# Patient Record
Sex: Female | Born: 1965
Health system: Southern US, Community
[De-identification: ages and names within clinical notes are randomized; demographics above are authoritative.]

## PROBLEM LIST (undated history)

## (undated) DIAGNOSIS — R945 Abnormal results of liver function studies: Secondary | ICD-10-CM

## (undated) DIAGNOSIS — K760 Fatty (change of) liver, not elsewhere classified: Secondary | ICD-10-CM

## (undated) DIAGNOSIS — E785 Hyperlipidemia, unspecified: Secondary | ICD-10-CM

## (undated) DIAGNOSIS — R7611 Nonspecific reaction to tuberculin skin test without active tuberculosis: Secondary | ICD-10-CM

## (undated) DIAGNOSIS — C801 Malignant (primary) neoplasm, unspecified: Secondary | ICD-10-CM

## (undated) DIAGNOSIS — C189 Malignant neoplasm of colon, unspecified: Secondary | ICD-10-CM

## (undated) DIAGNOSIS — E669 Obesity, unspecified: Secondary | ICD-10-CM

## (undated) DIAGNOSIS — K219 Gastro-esophageal reflux disease without esophagitis: Secondary | ICD-10-CM

## (undated) DIAGNOSIS — B019 Varicella without complication: Secondary | ICD-10-CM

## (undated) DIAGNOSIS — N951 Menopausal and female climacteric states: Secondary | ICD-10-CM

## (undated) DIAGNOSIS — J302 Other seasonal allergic rhinitis: Secondary | ICD-10-CM

## (undated) DIAGNOSIS — E8881 Metabolic syndrome: Secondary | ICD-10-CM

## (undated) HISTORY — DX: Hyperlipidemia, unspecified: E78.5

## (undated) HISTORY — DX: Nonspecific reaction to tuberculin skin test without active tuberculosis: R76.11

## (undated) HISTORY — PX: SPINE SURGERY: SHX786

## (undated) HISTORY — DX: Obesity, unspecified: E66.9

## (undated) HISTORY — DX: Malignant (primary) neoplasm, unspecified: C80.1

## (undated) HISTORY — DX: Menopausal and female climacteric states: N95.1

## (undated) HISTORY — DX: Fatty (change of) liver, not elsewhere classified: K76.0

## (undated) HISTORY — PX: COLON SURGERY: SHX602

## (undated) HISTORY — DX: Other seasonal allergic rhinitis: J30.2

## (undated) HISTORY — DX: Gastro-esophageal reflux disease without esophagitis: K21.9

## (undated) HISTORY — PX: LUMBAR DISC SURGERY: SHX700

## (undated) HISTORY — DX: Abnormal results of liver function studies: R94.5

## (undated) HISTORY — DX: Metabolic syndrome: E88.81

## (undated) HISTORY — DX: Varicella without complication: B01.9

## (undated) HISTORY — PX: ABDOMINAL HYSTERECTOMY: SHX81

---

## 2011-04-10 HISTORY — PX: HEMICOLECTOMY: SHX854

## 2011-10-08 DIAGNOSIS — E785 Hyperlipidemia, unspecified: Secondary | ICD-10-CM

## 2011-10-08 DIAGNOSIS — J302 Other seasonal allergic rhinitis: Secondary | ICD-10-CM | POA: Insufficient documentation

## 2011-10-08 DIAGNOSIS — E782 Mixed hyperlipidemia: Secondary | ICD-10-CM | POA: Insufficient documentation

## 2011-10-08 HISTORY — DX: Other seasonal allergic rhinitis: J30.2

## 2011-10-08 HISTORY — DX: Hyperlipidemia, unspecified: E78.5

## 2012-04-09 DIAGNOSIS — C801 Malignant (primary) neoplasm, unspecified: Secondary | ICD-10-CM

## 2012-04-09 HISTORY — DX: Malignant (primary) neoplasm, unspecified: C80.1

## 2012-07-03 DIAGNOSIS — K219 Gastro-esophageal reflux disease without esophagitis: Secondary | ICD-10-CM | POA: Insufficient documentation

## 2014-09-05 ENCOUNTER — Ambulatory Visit (INDEPENDENT_AMBULATORY_CARE_PROVIDER_SITE_OTHER): Payer: 59 | Admitting: Family Medicine

## 2014-09-05 VITALS — BP 122/86 | HR 83 | Temp 98.8°F | Resp 14 | Ht 65.0 in | Wt 202.8 lb

## 2014-09-05 DIAGNOSIS — Z85038 Personal history of other malignant neoplasm of large intestine: Secondary | ICD-10-CM | POA: Diagnosis not present

## 2014-09-05 DIAGNOSIS — S29019A Strain of muscle and tendon of unspecified wall of thorax, initial encounter: Secondary | ICD-10-CM | POA: Diagnosis not present

## 2014-09-05 DIAGNOSIS — S2341XA Sprain of ribs, initial encounter: Secondary | ICD-10-CM

## 2014-09-05 MED ORDER — METHOCARBAMOL 500 MG PO TABS: 500.0000 mg | ORAL_TABLET | Freq: Four times a day (QID) | ORAL | Status: DC

## 2014-09-05 NOTE — Progress Notes (Signed)
Subjective:    Patient ID: Nicole Rosario, female    DOB: [birth year], 49 y.o.   MRN: [id number]  09/05/2014  Chest Pain and Back Pain   HPI This 49 y.o. female presents for evaluation of chest pain and rib pain.  No injury.  Onset three days ago.  Has progressively worsened this morning at 4:00am.  Located mid R back.  Movement makes worse.  Twisting makes worse.  Overhead or reaching with R hand makes worse.  No n/t/w.  Mild pain with deep breathing.  No SOB.  Severity 6/10.  Took 2 Aleve at 4:30am.  Some relief with Aleve.  No ice or heat.   Worked this week; yesterday mowed the grass; probably overdid it.  Lifted heavy bags of rocks; went swimming.  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  Works in two days.  History of colon cancer stage III; s/p surgical resection and chemotherapy.  Followed at [facility].  History of GERD and well controlled with Prilosec and Zantac.  PCP: Carlena Bjornstad.   Moved to [place] 1.5 years ago to work at [employer] in antenatal.   Review of Systems  Constitutional: Negative for fever, chills, diaphoresis and fatigue.  Respiratory: Negative for cough and shortness of breath.   Cardiovascular: Negative for chest pain, palpitations and leg swelling.  Gastrointestinal: Negative for nausea, vomiting, abdominal pain and diarrhea.  Musculoskeletal: Positive for myalgias and back pain. Negative for gait problem, neck pain and neck stiffness.  Skin: Negative for rash.  Neurological: Negative for weakness and numbness.    Past Medical History  Diagnosis Date  . GERD (gastroesophageal reflux disease)   . Cancer 04/09/2012    Colon cancer Stage III at splenic flexure; followed at [facility]; s/p surgery and chemo.   Past Surgical History  Procedure Laterality Date  . Colon surgery     Allergies  Allergen Reactions  . Erythromycin    Current Outpatient Prescriptions  Medication Sig Dispense Refill  . Cetirizine HCl (ZYRTEC PO) Take by mouth.    . Cholecalciferol (VITAMIN  D PO) Take by mouth.    . Cyanocobalamin (VITAMIN B-12 PO) Take by mouth.    . IRON PO Take by mouth.    Marland Kitchen omeprazole (PRILOSEC) 20 MG capsule Take 20 mg by mouth daily.    . ranitidine (ZANTAC) 150 MG tablet Take 150 mg by mouth 2 (two) times daily.    . methocarbamol (ROBAXIN) 500 MG tablet Take 1-2 tablets (500-1,000 mg total) by mouth 4 (four) times daily. 60 tablet 0   No current facility-administered medications for this visit.   History   Social History  . Marital Status: Single    Spouse Name: N/A  . Number of Children: N/A  . Years of Education: N/A   Occupational History  . RN     [employer] in antenatal and L&D.   Social History Main Topics  . Smoking status: Never Smoker   . Smokeless tobacco: Not on file  . Alcohol Use: Not on file  . Drug Use: Not on file  . Sexual Activity: Not on file   Other Topics Concern  . Not on file   Social History Narrative       Objective:    BP 122/86 mmHg  Pulse 83  Temp(Src) 98.8 F (37.1 C) (Oral)  Resp 14  Ht 5\' 5"  (1.651 m)  Wt 202 lb 12.8 oz (91.989 kg)  BMI 33.75 kg/m2  SpO2 98% Physical Exam  Constitutional:  She is oriented to person, place, and time. She appears well-developed and well-nourished. No distress.  HENT:  Head: Normocephalic and atraumatic.  Eyes: Conjunctivae are normal. Pupils are equal, round, and reactive to light.  Neck: Normal range of motion. Neck supple.  Cardiovascular: Normal rate, regular rhythm and normal heart sounds.  Exam reveals no gallop and no friction rub.   No murmur heard. Pulmonary/Chest: Effort normal and breath sounds normal. She has no wheezes. She has no rales.  Musculoskeletal:       Cervical back: She exhibits pain. She exhibits normal range of motion, no tenderness, no bony tenderness and no spasm.       Thoracic back: She exhibits decreased range of motion, tenderness, bony tenderness and pain. She exhibits no spasm.  Cervical spine: non-tender midline;  non-tender paraspinal regions B; full ROM cervical spine without limitation.  Motor 5/5 BUE.  Grip 5/5. Thoracic spine: No midline TTP; +TTP lateral R ribs.  Pain with extension, rotation side to side.    Neurological: She is alert and oriented to person, place, and time.  Skin: She is not diaphoretic.  Psychiatric: She has a normal mood and affect. Her behavior is normal.  Nursing note and vitals reviewed.  No results found for this or any previous visit.     Assessment & Plan:   1. Sprain and strain of ribs, initial encounter   2. H/O colon cancer, stage III    -New. -Pt declined xray at this time; if no improvement, RTC 1-2 weeks for xray considering history of Colon cancer. -Continue Aleve bid to tid scheduled. -Rx for Robaxin provided. -Recommend rest, stretching; avoid lifting more than ten pounds for the next two weeks. -Heat to area bid to tid for 15 minutes.   Meds ordered this encounter  Medications  . omeprazole (PRILOSEC) 20 MG capsule    Sig: Take 20 mg by mouth daily.  . Cetirizine HCl (ZYRTEC PO)    Sig: Take by mouth.  . ranitidine (ZANTAC) 150 MG tablet    Sig: Take 150 mg by mouth 2 (two) times daily.  . IRON PO    Sig: Take by mouth.  . Cholecalciferol (VITAMIN D PO)    Sig: Take by mouth.  . Cyanocobalamin (VITAMIN B-12 PO)    Sig: Take by mouth.  . methocarbamol (ROBAXIN) 500 MG tablet    Sig: Take 1-2 tablets (500-1,000 mg total) by mouth 4 (four) times daily.    Dispense:  60 tablet    Refill:  0    No Follow-up on file.     Romayne Ticas Elayne Guerin, M.D. [facility] [address] [place], [place]  [address] [phone] phone [phone] fax

## 2015-04-22 DIAGNOSIS — R918 Other nonspecific abnormal finding of lung field: Secondary | ICD-10-CM | POA: Diagnosis not present

## 2015-04-22 DIAGNOSIS — Z79899 Other long term (current) drug therapy: Secondary | ICD-10-CM | POA: Diagnosis not present

## 2015-04-22 DIAGNOSIS — C187 Malignant neoplasm of sigmoid colon: Secondary | ICD-10-CM | POA: Diagnosis not present

## 2015-04-22 DIAGNOSIS — Z9889 Other specified postprocedural states: Secondary | ICD-10-CM | POA: Diagnosis not present

## 2015-04-22 DIAGNOSIS — Z7982 Long term (current) use of aspirin: Secondary | ICD-10-CM | POA: Diagnosis not present

## 2015-04-22 DIAGNOSIS — C19 Malignant neoplasm of rectosigmoid junction: Secondary | ICD-10-CM | POA: Diagnosis not present

## 2015-04-22 DIAGNOSIS — R911 Solitary pulmonary nodule: Secondary | ICD-10-CM | POA: Diagnosis not present

## 2015-06-02 ENCOUNTER — Other Ambulatory Visit: Payer: Self-pay

## 2015-06-02 DIAGNOSIS — N926 Irregular menstruation, unspecified: Secondary | ICD-10-CM | POA: Diagnosis not present

## 2015-06-02 DIAGNOSIS — E559 Vitamin D deficiency, unspecified: Secondary | ICD-10-CM | POA: Diagnosis not present

## 2015-06-02 DIAGNOSIS — Z1231 Encounter for screening mammogram for malignant neoplasm of breast: Secondary | ICD-10-CM

## 2015-06-02 DIAGNOSIS — Z6834 Body mass index (BMI) 34.0-34.9, adult: Secondary | ICD-10-CM | POA: Diagnosis not present

## 2015-06-02 DIAGNOSIS — E782 Mixed hyperlipidemia: Secondary | ICD-10-CM | POA: Diagnosis not present

## 2015-06-13 ENCOUNTER — Ambulatory Visit
Admission: RE | Admit: 2015-06-13 | Discharge: 2015-06-13 | Disposition: A | Discharge status: Home / Self Care | Payer: 59 | Source: Ambulatory Visit

## 2015-06-13 DIAGNOSIS — Z1231 Encounter for screening mammogram for malignant neoplasm of breast: Secondary | ICD-10-CM | POA: Diagnosis not present

## 2015-06-21 DIAGNOSIS — M50322 Other cervical disc degeneration at C5-C6 level: Secondary | ICD-10-CM | POA: Diagnosis not present

## 2015-06-21 DIAGNOSIS — M5442 Lumbago with sciatica, left side: Secondary | ICD-10-CM | POA: Diagnosis not present

## 2015-06-21 DIAGNOSIS — M9901 Segmental and somatic dysfunction of cervical region: Secondary | ICD-10-CM | POA: Diagnosis not present

## 2015-06-21 DIAGNOSIS — M9902 Segmental and somatic dysfunction of thoracic region: Secondary | ICD-10-CM | POA: Diagnosis not present

## 2015-06-21 DIAGNOSIS — M9903 Segmental and somatic dysfunction of lumbar region: Secondary | ICD-10-CM | POA: Diagnosis not present

## 2015-06-21 DIAGNOSIS — Q72812 Congenital shortening of left lower limb: Secondary | ICD-10-CM | POA: Diagnosis not present

## 2015-06-21 DIAGNOSIS — M9904 Segmental and somatic dysfunction of sacral region: Secondary | ICD-10-CM | POA: Diagnosis not present

## 2015-06-21 DIAGNOSIS — M9905 Segmental and somatic dysfunction of pelvic region: Secondary | ICD-10-CM | POA: Diagnosis not present

## 2015-06-21 DIAGNOSIS — M5384 Other specified dorsopathies, thoracic region: Secondary | ICD-10-CM | POA: Diagnosis not present

## 2015-06-27 DIAGNOSIS — M9904 Segmental and somatic dysfunction of sacral region: Secondary | ICD-10-CM | POA: Diagnosis not present

## 2015-06-27 DIAGNOSIS — M9902 Segmental and somatic dysfunction of thoracic region: Secondary | ICD-10-CM | POA: Diagnosis not present

## 2015-06-27 DIAGNOSIS — M5384 Other specified dorsopathies, thoracic region: Secondary | ICD-10-CM | POA: Diagnosis not present

## 2015-06-27 DIAGNOSIS — M9903 Segmental and somatic dysfunction of lumbar region: Secondary | ICD-10-CM | POA: Diagnosis not present

## 2015-06-27 DIAGNOSIS — M9905 Segmental and somatic dysfunction of pelvic region: Secondary | ICD-10-CM | POA: Diagnosis not present

## 2015-06-27 DIAGNOSIS — M9901 Segmental and somatic dysfunction of cervical region: Secondary | ICD-10-CM | POA: Diagnosis not present

## 2015-06-27 DIAGNOSIS — M50322 Other cervical disc degeneration at C5-C6 level: Secondary | ICD-10-CM | POA: Diagnosis not present

## 2015-06-27 DIAGNOSIS — M5442 Lumbago with sciatica, left side: Secondary | ICD-10-CM | POA: Diagnosis not present

## 2015-06-27 DIAGNOSIS — Q72812 Congenital shortening of left lower limb: Secondary | ICD-10-CM | POA: Diagnosis not present

## 2015-06-29 DIAGNOSIS — N926 Irregular menstruation, unspecified: Secondary | ICD-10-CM | POA: Diagnosis not present

## 2015-06-29 DIAGNOSIS — Z124 Encounter for screening for malignant neoplasm of cervix: Secondary | ICD-10-CM | POA: Diagnosis not present

## 2015-06-29 DIAGNOSIS — Z01419 Encounter for gynecological examination (general) (routine) without abnormal findings: Secondary | ICD-10-CM | POA: Diagnosis not present

## 2015-06-29 LAB — HM PAP SMEAR: HM Pap smear: NEGATIVE

## 2015-07-06 DIAGNOSIS — M50322 Other cervical disc degeneration at C5-C6 level: Secondary | ICD-10-CM | POA: Diagnosis not present

## 2015-07-06 DIAGNOSIS — M9903 Segmental and somatic dysfunction of lumbar region: Secondary | ICD-10-CM | POA: Diagnosis not present

## 2015-07-06 DIAGNOSIS — M5442 Lumbago with sciatica, left side: Secondary | ICD-10-CM | POA: Diagnosis not present

## 2015-07-06 DIAGNOSIS — Q72812 Congenital shortening of left lower limb: Secondary | ICD-10-CM | POA: Diagnosis not present

## 2015-07-06 DIAGNOSIS — M9902 Segmental and somatic dysfunction of thoracic region: Secondary | ICD-10-CM | POA: Diagnosis not present

## 2015-07-06 DIAGNOSIS — M9901 Segmental and somatic dysfunction of cervical region: Secondary | ICD-10-CM | POA: Diagnosis not present

## 2015-07-06 DIAGNOSIS — M9904 Segmental and somatic dysfunction of sacral region: Secondary | ICD-10-CM | POA: Diagnosis not present

## 2015-07-06 DIAGNOSIS — M9905 Segmental and somatic dysfunction of pelvic region: Secondary | ICD-10-CM | POA: Diagnosis not present

## 2015-07-06 DIAGNOSIS — M5384 Other specified dorsopathies, thoracic region: Secondary | ICD-10-CM | POA: Diagnosis not present

## 2015-07-14 DIAGNOSIS — M5442 Lumbago with sciatica, left side: Secondary | ICD-10-CM | POA: Diagnosis not present

## 2015-07-14 DIAGNOSIS — M9905 Segmental and somatic dysfunction of pelvic region: Secondary | ICD-10-CM | POA: Diagnosis not present

## 2015-07-14 DIAGNOSIS — M50322 Other cervical disc degeneration at C5-C6 level: Secondary | ICD-10-CM | POA: Diagnosis not present

## 2015-07-14 DIAGNOSIS — M9901 Segmental and somatic dysfunction of cervical region: Secondary | ICD-10-CM | POA: Diagnosis not present

## 2015-07-14 DIAGNOSIS — M5384 Other specified dorsopathies, thoracic region: Secondary | ICD-10-CM | POA: Diagnosis not present

## 2015-07-14 DIAGNOSIS — M9902 Segmental and somatic dysfunction of thoracic region: Secondary | ICD-10-CM | POA: Diagnosis not present

## 2015-07-14 DIAGNOSIS — Q72812 Congenital shortening of left lower limb: Secondary | ICD-10-CM | POA: Diagnosis not present

## 2015-07-14 DIAGNOSIS — M9904 Segmental and somatic dysfunction of sacral region: Secondary | ICD-10-CM | POA: Diagnosis not present

## 2015-07-14 DIAGNOSIS — M9903 Segmental and somatic dysfunction of lumbar region: Secondary | ICD-10-CM | POA: Diagnosis not present

## 2015-07-20 DIAGNOSIS — H5213 Myopia, bilateral: Secondary | ICD-10-CM | POA: Diagnosis not present

## 2015-07-20 DIAGNOSIS — H524 Presbyopia: Secondary | ICD-10-CM | POA: Diagnosis not present

## 2015-07-20 DIAGNOSIS — H52223 Regular astigmatism, bilateral: Secondary | ICD-10-CM | POA: Diagnosis not present

## 2015-07-21 DIAGNOSIS — R945 Abnormal results of liver function studies: Secondary | ICD-10-CM | POA: Diagnosis not present

## 2015-08-04 DIAGNOSIS — Z980 Intestinal bypass and anastomosis status: Secondary | ICD-10-CM | POA: Diagnosis not present

## 2015-08-04 DIAGNOSIS — R1032 Left lower quadrant pain: Secondary | ICD-10-CM | POA: Diagnosis not present

## 2015-08-04 DIAGNOSIS — Z85038 Personal history of other malignant neoplasm of large intestine: Secondary | ICD-10-CM | POA: Diagnosis not present

## 2015-08-04 DIAGNOSIS — Z79899 Other long term (current) drug therapy: Secondary | ICD-10-CM | POA: Diagnosis not present

## 2015-08-04 DIAGNOSIS — Z791 Long term (current) use of non-steroidal anti-inflammatories (NSAID): Secondary | ICD-10-CM | POA: Diagnosis not present

## 2015-08-04 DIAGNOSIS — Z7982 Long term (current) use of aspirin: Secondary | ICD-10-CM | POA: Diagnosis not present

## 2015-08-11 DIAGNOSIS — N926 Irregular menstruation, unspecified: Secondary | ICD-10-CM | POA: Diagnosis not present

## 2015-10-28 DIAGNOSIS — Z9221 Personal history of antineoplastic chemotherapy: Secondary | ICD-10-CM | POA: Diagnosis not present

## 2015-10-28 DIAGNOSIS — Z9049 Acquired absence of other specified parts of digestive tract: Secondary | ICD-10-CM | POA: Diagnosis not present

## 2015-10-28 DIAGNOSIS — R918 Other nonspecific abnormal finding of lung field: Secondary | ICD-10-CM | POA: Diagnosis not present

## 2015-10-28 DIAGNOSIS — Z923 Personal history of irradiation: Secondary | ICD-10-CM | POA: Diagnosis not present

## 2015-10-28 DIAGNOSIS — C187 Malignant neoplasm of sigmoid colon: Secondary | ICD-10-CM | POA: Diagnosis not present

## 2015-10-28 DIAGNOSIS — Z85038 Personal history of other malignant neoplasm of large intestine: Secondary | ICD-10-CM | POA: Diagnosis not present

## 2015-10-28 DIAGNOSIS — Z08 Encounter for follow-up examination after completed treatment for malignant neoplasm: Secondary | ICD-10-CM | POA: Diagnosis not present

## 2015-10-28 DIAGNOSIS — Z7982 Long term (current) use of aspirin: Secondary | ICD-10-CM | POA: Diagnosis not present

## 2015-10-28 DIAGNOSIS — Z79899 Other long term (current) drug therapy: Secondary | ICD-10-CM | POA: Diagnosis not present

## 2015-11-29 DIAGNOSIS — Z0000 Encounter for general adult medical examination without abnormal findings: Secondary | ICD-10-CM | POA: Diagnosis not present

## 2016-01-10 MED FILL — VENTOLIN HFA 90 MCG INHALER: 108 (90 BAS | 17 days supply | Qty: 18 | Fill #0

## 2016-01-10 MED FILL — predniSONE 20 MG TABS: 20 | 5 days supply | Qty: 15 | Fill #0

## 2016-04-30 DIAGNOSIS — C189 Malignant neoplasm of colon, unspecified: Secondary | ICD-10-CM | POA: Diagnosis not present

## 2016-04-30 DIAGNOSIS — C187 Malignant neoplasm of sigmoid colon: Secondary | ICD-10-CM | POA: Diagnosis not present

## 2016-08-21 ENCOUNTER — Ambulatory Visit (INDEPENDENT_AMBULATORY_CARE_PROVIDER_SITE_OTHER): Payer: 59

## 2016-08-21 ENCOUNTER — Ambulatory Visit (HOSPITAL_COMMUNITY)
Admission: EM | Admit: 2016-08-21 | Discharge: 2016-08-21 | Disposition: A | Discharge status: Home / Self Care | Payer: 59 | Attending: Internal Medicine | Admitting: Internal Medicine

## 2016-08-21 ENCOUNTER — Encounter (HOSPITAL_COMMUNITY): Payer: Self-pay | Admitting: Family Medicine

## 2016-08-21 DIAGNOSIS — S60221A Contusion of right hand, initial encounter: Secondary | ICD-10-CM

## 2016-08-21 DIAGNOSIS — S6991XA Unspecified injury of right wrist, hand and finger(s), initial encounter: Secondary | ICD-10-CM | POA: Diagnosis not present

## 2016-08-21 NOTE — ED Triage Notes (Signed)
Pt her for right hand injury. sts yesterday some PVC pipe fell on her hand.

## 2016-08-21 NOTE — ED Provider Notes (Signed)
CSN: [id number]     Arrival date & time 08/21/16  1043 History   First MD Initiated Contact with Patient 08/21/16 1123     Chief Complaint  Patient presents with  . Hand Injury   (Consider location/radiation/quality/duration/timing/severity/associated sxs/prior Treatment) 51 year old female presents to clinic for evaluation of hand injury. She was at home yesterday, when a PVC pipe fell on her right hand. She is right handed, states she has 1 out of 10 pain dull, aching, nonradiating, she has no loss of function in the extremity, is able to form a full grip. Pain is intermittent, gradually improving, and helped with over-the-counter ibuprofen and Tylenol.   The history is provided by the patient.  Hand Injury    Past Medical History:  Diagnosis Date  . Cancer ([place]) 04/09/2012   Colon cancer Stage III at splenic flexure; followed at [facility]; s/p surgery and chemo.  Marland Kitchen GERD (gastroesophageal reflux disease)    Past Surgical History:  Procedure Laterality Date  . COLON SURGERY     Family History  Problem Relation Age of Onset  . Heart disease Mother   . Hypertension Father   . Cancer Maternal Grandmother   . Heart disease Maternal Grandfather   . Cancer Paternal Grandmother    Social History  Substance Use Topics  . Smoking status: Never Smoker  . Smokeless tobacco: Never Used  . Alcohol use Not on file   OB History    No data available     Review of Systems  Constitutional: Negative.   HENT: Negative.   Respiratory: Negative.   Cardiovascular: Negative.   Gastrointestinal: Negative.   Musculoskeletal: Positive for joint swelling.  Skin: Positive for color change.  Neurological: Negative.     Allergies  Erythromycin  Home Medications   Prior to Admission medications   Medication Sig Start Date End Date Taking? Authorizing Provider  Cetirizine HCl (ZYRTEC PO) Take by mouth.    [provider]  Cholecalciferol (VITAMIN D PO) Take by mouth.    [provider]  Cyanocobalamin (VITAMIN B-12 PO) Take by mouth.    [provider]  IRON PO Take by mouth.    [provider]  methocarbamol (ROBAXIN) 500 MG tablet Take 1-2 tablets (500-1,000 mg total) by mouth 4 (four) times daily. 09/05/14   Wardell Honour, MD  omeprazole (PRILOSEC) 20 MG capsule Take 20 mg by mouth daily.    [provider]  ranitidine (ZANTAC) 150 MG tablet Take 150 mg by mouth 2 (two) times daily.    [provider]   Meds Ordered and Administered this Visit  Medications - No data to display  BP (!) 142/92   Pulse 78   Resp 18   SpO2 98%  No data found.   Physical Exam  Constitutional: She is oriented to person, place, and time. She appears well-developed and well-nourished. No distress.  HENT:  Head: Normocephalic and atraumatic.  Right Ear: External ear normal.  Left Ear: External ear normal.  Eyes: Conjunctivae are normal.  Neck: Normal range of motion.  Musculoskeletal: She exhibits tenderness. She exhibits no deformity.  Area of tenderness, and the fourth distal metaphalangeal joint, capillary refill less than 2 seconds, sensory, motor function remain intact distally.  Neurological: She is alert and oriented to person, place, and time.  Skin: Skin is warm and dry. Capillary refill takes less than 2 seconds. No rash noted. She is not diaphoretic. No erythema.  Psychiatric: She has a normal mood and  affect. Her behavior is normal.  Nursing note and vitals reviewed.   Urgent Care Course     Procedures (including critical care time)  Labs Review Labs Reviewed - No data to display  Imaging Review Dg Hand Complete Right  Result Date: 08/21/2016 CLINICAL DATA:  Injury right hand when a height rolled off the roof yesterday and struck the hand. EXAM: RIGHT HAND - COMPLETE 3+ VIEW COMPARISON:  None in PACs FINDINGS: The bones are subjectively adequately mineralized. The joint spaces are well maintained. There is no  acute fracture or dislocation. The soft tissues of the hand exhibit no acute abnormalities. The visualized portions of the carpal bones and distal radius and ulna appear normal. IMPRESSION: There is no acute bony abnormality of the right hand. Electronically Signed   By: David  Martinique M.D.   On: 08/21/2016 11:55       MDM   1. Contusion of right hand, initial encounter    No fracture dislocation seen on x-ray. Recommended rest, ice, elevation of the affected hand, over-the-counter Tylenol, or ibuprofen as needed. Follow-up with primary care, return to clinic, or follow-up with hand specialist if pain persists    Barnet Glasgow, NP 08/21/16 1222

## 2016-08-21 NOTE — Discharge Instructions (Signed)
No fracture or dislocation on Xray, recommend rest, ice, elevation, OTC tylenol or ibuprofen. If pain persists or fails to resolve follow up with your pcp, return to clinic, or go to a hand specialists as needed.

## 2016-08-21 NOTE — ED Notes (Signed)
Bed: UC07 Expected date:  Expected time:  Means of arrival:  Comments: Hold for Marschke, A (hand injury)

## 2016-10-29 DIAGNOSIS — R911 Solitary pulmonary nodule: Secondary | ICD-10-CM | POA: Diagnosis not present

## 2016-10-29 DIAGNOSIS — C189 Malignant neoplasm of colon, unspecified: Secondary | ICD-10-CM | POA: Diagnosis not present

## 2016-10-29 DIAGNOSIS — C187 Malignant neoplasm of sigmoid colon: Secondary | ICD-10-CM | POA: Diagnosis not present

## 2017-02-23 DIAGNOSIS — H5213 Myopia, bilateral: Secondary | ICD-10-CM | POA: Diagnosis not present

## 2017-02-23 DIAGNOSIS — H524 Presbyopia: Secondary | ICD-10-CM | POA: Diagnosis not present

## 2017-02-23 DIAGNOSIS — H52223 Regular astigmatism, bilateral: Secondary | ICD-10-CM | POA: Diagnosis not present

## 2017-04-04 ENCOUNTER — Ambulatory Visit (HOSPITAL_COMMUNITY)
Admission: EM | Admit: 2017-04-04 | Discharge: 2017-04-04 | Disposition: A | Discharge status: Home / Self Care | Payer: 59 | Attending: Emergency Medicine | Admitting: Emergency Medicine

## 2017-04-04 ENCOUNTER — Telehealth: Payer: 59 | Admitting: Family

## 2017-04-04 ENCOUNTER — Other Ambulatory Visit: Payer: Self-pay

## 2017-04-04 ENCOUNTER — Encounter (HOSPITAL_COMMUNITY): Payer: Self-pay | Admitting: Emergency Medicine

## 2017-04-04 DIAGNOSIS — M5432 Sciatica, left side: Secondary | ICD-10-CM

## 2017-04-04 DIAGNOSIS — S39012A Strain of muscle, fascia and tendon of lower back, initial encounter: Secondary | ICD-10-CM

## 2017-04-04 MED ORDER — PREDNISONE 10 MG (21) PO TBPK: ORAL_TABLET | ORAL | 0 refills | Status: DC

## 2017-04-04 MED ORDER — TRAMADOL HCL 50 MG PO TABS: 50.0000 mg | ORAL_TABLET | Freq: Four times a day (QID) | ORAL | 0 refills | Status: DC | PRN

## 2017-04-04 MED FILL — traMADol HCL 50 MG TABS: 50 | 4 days supply | Qty: 15 | Fill #0

## 2017-04-04 MED FILL — predniSONE 10 MG TABS: 10 | 6 days supply | Qty: 21 | Fill #0

## 2017-04-04 NOTE — ED Triage Notes (Addendum)
Back pain 2 weeks Sunday.  No known injury.  Pain from buttocks down left leg.   Patient has a history of low back pain that she goes to chiropractor for periodically

## 2017-04-04 NOTE — Discharge Instructions (Signed)
Meds as directed, take prednisone with food.

## 2017-04-04 NOTE — Progress Notes (Signed)
Thank you for the details you included in the comment boxes. Those details are very helpful in determining the best course of treatment for you and help [place] to provide the best care. Given that you are already using the treatments we would give you here, without improvement, this needs to be evaluated face to face to rule out serious nerve impingement.  Based on what you shared with me it looks like you have a serious condition that should be evaluated in a face to face office visit.  NOTE: Even if you have entered your credit card information for this eVisit, you will not be charged.   If you are having a true medical emergency please call 911.  If you need an urgent face to face visit, [facility] has four urgent care centers for your convenience.  If you need care fast and have a high deductible or no insurance consider:   [email or web address]  [phone]  [address], [address] [place], [place] [address] 8 am to 8 pm Monday-Friday 10 am to 4 pm Saturday-Sunday   The following sites will take your  insurance:    . [facility] [place] a Provider at this Location  [address] [place], [place] [address] . 10 am to 8 pm Monday-Friday . 12 pm to 8 pm Saturday-Sunday   . [facility] [place] a Provider at this Location  [place], [place], [place] [address] . 8 am to 8 pm Monday-Friday . 9 am to 6 pm Saturday . 11 am to 6 pm Sunday   . [facility] at [place] Get Driving Directions  [address] [place],  [address] . 8 am to 8 pm Monday-Friday . 8 am to 4 pm Saturday-Sunday   Your e-visit answers were reviewed by a board certified advanced clinical practitioner to complete your personal care plan.  Thank you for using e-Visits.

## 2017-04-04 NOTE — ED Provider Notes (Signed)
[facility]    CSN: [id number] Arrival date & time: 04/04/17  1005     History   Chief Complaint Chief Complaint  Patient presents with  . Back Pain    HPI Nicole Rosario is a 51 y.o. female.   51 year old female complaining of pain to the left buttock radiating down to the left posterior leg for about 1-1/2 weeks. Describes it as often a sharp shooting type pain sometimes a dull nagging crescendo, decrescendo type pain. Denies any known injury. No fall, no blunt trauma, no straining or exercising or other activities. She woke up one morning with the pain. Is primarily starting in the left buttock and radiating down the posterior left thigh sometimes into the calf. It seems to be worse in the morning upon awakening. It is worse when sitting and performing certain movements. Seems to feel better when standing and ambulating.       Past Medical History:  Diagnosis Date  . Cancer ([place]) 04/09/2012   Colon cancer Stage III at splenic flexure; followed at [facility]; s/p surgery and chemo.  Marland Kitchen GERD (gastroesophageal reflux disease)     Patient Active Problem List   Diagnosis Date Noted  . H/O colon cancer, stage III 09/05/2014    Past Surgical History:  Procedure Laterality Date  . COLON SURGERY      OB History    No data available       Home Medications    Prior to Admission medications   Medication Sig Start Date End Date Taking? Authorizing Provider  methocarbamol (ROBAXIN) 500 MG tablet Take 1-2 tablets (500-1,000 mg total) by mouth 4 (four) times daily. 09/05/14  Yes Wardell Honour, MD  Cetirizine HCl (ZYRTEC PO) Take by mouth.    [provider]  Cholecalciferol (VITAMIN D PO) Take by mouth.    [provider]  Cyanocobalamin (VITAMIN B-12 PO) Take by mouth.    [provider]  IRON PO Take by mouth.    [provider]  omeprazole (PRILOSEC) 20 MG capsule Take 20 mg by mouth daily.    [provider]  predniSONE  (STERAPRED UNI-PAK 21 TAB) 10 MG (21) TBPK tablet Dispense one 6 day pack. Take as directed with food. 04/04/17   Janne Napoleon, NP  ranitidine (ZANTAC) 150 MG tablet Take 150 mg by mouth 2 (two) times daily.    [provider]  traMADol (ULTRAM) 50 MG tablet Take 1 tablet (50 mg total) by mouth every 6 (six) hours as needed. 04/04/17   Janne Napoleon, NP    Family History Family History  Problem Relation Age of Onset  . Heart disease Mother   . Hypertension Father   . Cancer Maternal Grandmother   . Heart disease Maternal Grandfather   . Cancer Paternal Grandmother     Social History Social History   Tobacco Use  . Smoking status: Never Smoker  . Smokeless tobacco: Never Used  Substance Use Topics  . Alcohol use: Not on file  . Drug use: Not on file     Allergies   Erythromycin   Review of Systems Review of Systems  Constitutional: Negative for activity change, chills and fever.  HENT: Negative.   Respiratory: Negative.   Cardiovascular: Negative.   Musculoskeletal:       As per HPI  Skin: Negative for color change, pallor and rash.  Neurological: Negative.      Physical Exam Triage Vital Signs ED Triage Vitals  Enc Vitals Group  BP 04/04/17 1033 (!) 155/97     Pulse Rate 04/04/17 1033 84     Resp 04/04/17 1033 18     Temp 04/04/17 1033 97.6 F (36.4 C)     Temp Source 04/04/17 1033 Oral     SpO2 04/04/17 1033 97 %     Weight --      Height --      Head Circumference --      Peak Flow --      Pain Score 04/04/17 1031 5     Pain Loc --      Pain Edu? --      Excl. in [place]? --    No data found.  Updated Vital Signs BP (!) 155/97 (BP Location: Left Arm)   Pulse 84   Temp 97.6 F (36.4 C) (Oral)   Resp 18   SpO2 97%   Visual Acuity Right Eye Distance:   Left Eye Distance:   Bilateral Distance:    Right Eye Near:   Left Eye Near:    Bilateral Near:     Physical Exam  Constitutional: She is oriented to person, place, and time. She  appears well-developed and well-nourished. No distress.  HENT:  Head: Normocephalic and atraumatic.  Eyes: EOM are normal. Pupils are equal, round, and reactive to light.  Neck: Normal range of motion. Neck supple.  Pulmonary/Chest: Effort normal.  Musculoskeletal:  No muscle tenderness to the back, buttock or posterior thigh or calf musculature. Pain is exacerbated when sitting in a right angles and with left straight leg raise. Pain is greatest in the posterior thigh. Distal neurovascular and motor sensory grossly intact.  Lymphadenopathy:    She has no cervical adenopathy.  Neurological: She is alert and oriented to person, place, and time. No cranial nerve deficit.  Skin: Skin is warm and dry.  Psychiatric: She has a normal mood and affect.     UC Treatments / Results  Labs (all labs ordered are listed, but only abnormal results are displayed) Labs Reviewed - No data to display  EKG  EKG Interpretation None       Radiology No results found.  Procedures Procedures (including critical care time)  Medications Ordered in UC Medications - No data to display   Initial Impression / Assessment and Plan / UC Course  I have reviewed the triage vital signs and the nursing notes.  Pertinent labs & imaging results that were available during my care of the patient were reviewed by me and considered in my medical decision making (see chart for details).    Meds as directed, take prednisone with food.    Final Clinical Impressions(s) / UC Diagnoses   Final diagnoses:  Sciatica of left side    ED Discharge Orders        Ordered    traMADol (ULTRAM) 50 MG tablet  Every 6 hours PRN     04/04/17 1115    predniSONE (STERAPRED UNI-PAK 21 TAB) 10 MG (21) TBPK tablet     04/04/17 1115       Controlled Substance Prescriptions [place] Controlled Substance Registry consulted? Not Applicable   Janne Napoleon, NP 04/04/17 1122

## 2017-04-09 HISTORY — PX: MICRODISCECTOMY LUMBAR: SUR864

## 2017-04-12 DIAGNOSIS — E559 Vitamin D deficiency, unspecified: Secondary | ICD-10-CM | POA: Diagnosis not present

## 2017-04-12 DIAGNOSIS — R252 Cramp and spasm: Secondary | ICD-10-CM | POA: Diagnosis not present

## 2017-04-12 DIAGNOSIS — M5432 Sciatica, left side: Secondary | ICD-10-CM | POA: Diagnosis not present

## 2017-04-12 DIAGNOSIS — E782 Mixed hyperlipidemia: Secondary | ICD-10-CM | POA: Diagnosis not present

## 2017-04-12 LAB — HEPATIC FUNCTION PANEL
ALT: 33 (ref 7–35)
ALT: 33 (ref 7–35)
AST: 25 (ref 13–35)
AST: 25 (ref 13–35)
Alkaline Phosphatase: 90 (ref 25–125)
Alkaline Phosphatase: 90 (ref 25–125)
BILIRUBIN, TOTAL: 0.3
Bilirubin, Total: 0.3

## 2017-04-12 LAB — BASIC METABOLIC PANEL
BUN: 16 (ref 4–21)
CREATININE: 0.8 (ref 0.5–1.1)
GLUCOSE: 84
POTASSIUM: 4.9 (ref 3.4–5.3)
SODIUM: 141 (ref 137–147)

## 2017-04-12 LAB — LIPID PANEL
CHOLESTEROL: 215 — AB (ref 0–200)
HDL: 53 (ref 35–70)
LDL Cholesterol: 111
LDl/HDL Ratio: 2.1
TRIGLYCERIDES: 256 — AB (ref 40–160)

## 2017-04-12 LAB — HEMOGLOBIN A1C
HEMOGLOBIN A1C: 5.7
Hemoglobin A1C: 5.7

## 2017-04-12 LAB — CBC AND DIFFERENTIAL
HCT: 43 (ref 36–46)
HCT: 43 (ref 36–46)
Hemoglobin: 14.7 (ref 12.0–16.0)
Hemoglobin: 14.7 (ref 12.0–16.0)
Platelets: 294 (ref 150–399)
Platelets: 294 (ref 150–399)
WBC: 7.4
WBC: 7.4

## 2017-04-12 LAB — TSH
TSH: 3.35 (ref 0.41–5.90)
TSH: 3.35 (ref 0.41–5.90)

## 2017-04-12 LAB — VITAMIN D 25 HYDROXY (VIT D DEFICIENCY, FRACTURES)
VIT D 25 HYDROXY: 58.5
Vit D, 25-Hydroxy: 58.5

## 2017-04-12 MED FILL — CYCLOBENZAPRINE 10 MG TAB: 10 | 6 days supply | Qty: 20 | Fill #0

## 2017-04-25 MED FILL — ALPRAZolam 0.25 MG TABS: 0.25 | 1 days supply | Qty: 4 | Fill #0

## 2017-05-02 ENCOUNTER — Other Ambulatory Visit (HOSPITAL_COMMUNITY): Payer: Self-pay | Admitting: Nurse Practitioner

## 2017-05-02 DIAGNOSIS — M5432 Sciatica, left side: Secondary | ICD-10-CM

## 2017-05-03 DIAGNOSIS — M545 Low back pain: Secondary | ICD-10-CM | POA: Diagnosis not present

## 2017-05-03 DIAGNOSIS — M5432 Sciatica, left side: Secondary | ICD-10-CM | POA: Diagnosis not present

## 2017-05-08 ENCOUNTER — Encounter (HOSPITAL_COMMUNITY): Payer: Self-pay

## 2017-05-08 ENCOUNTER — Ambulatory Visit (HOSPITAL_COMMUNITY)
Admission: RE | Admit: 2017-05-08 | Discharge: 2017-05-08 | Disposition: A | Discharge status: Home / Self Care | Payer: 59 | Source: Ambulatory Visit | Attending: Nurse Practitioner | Admitting: Nurse Practitioner

## 2017-05-08 DIAGNOSIS — M5432 Sciatica, left side: Secondary | ICD-10-CM | POA: Diagnosis not present

## 2017-05-08 DIAGNOSIS — M545 Low back pain: Secondary | ICD-10-CM | POA: Diagnosis not present

## 2017-05-09 DIAGNOSIS — M5432 Sciatica, left side: Secondary | ICD-10-CM | POA: Diagnosis not present

## 2017-05-09 DIAGNOSIS — M545 Low back pain: Secondary | ICD-10-CM | POA: Diagnosis not present

## 2017-05-13 DIAGNOSIS — M545 Low back pain: Secondary | ICD-10-CM | POA: Diagnosis not present

## 2017-05-13 DIAGNOSIS — M5432 Sciatica, left side: Secondary | ICD-10-CM | POA: Diagnosis not present

## 2017-05-16 DIAGNOSIS — M545 Low back pain: Secondary | ICD-10-CM | POA: Diagnosis not present

## 2017-05-16 DIAGNOSIS — M5432 Sciatica, left side: Secondary | ICD-10-CM | POA: Diagnosis not present

## 2017-05-22 DIAGNOSIS — M5432 Sciatica, left side: Secondary | ICD-10-CM | POA: Diagnosis not present

## 2017-05-22 DIAGNOSIS — M545 Low back pain: Secondary | ICD-10-CM | POA: Diagnosis not present

## 2017-05-23 ENCOUNTER — Other Ambulatory Visit: Payer: Self-pay | Admitting: Internal Medicine

## 2017-05-23 DIAGNOSIS — Z7689 Persons encountering health services in other specified circumstances: Secondary | ICD-10-CM | POA: Diagnosis not present

## 2017-05-23 DIAGNOSIS — Z7982 Long term (current) use of aspirin: Secondary | ICD-10-CM | POA: Diagnosis not present

## 2017-05-23 DIAGNOSIS — Z85038 Personal history of other malignant neoplasm of large intestine: Secondary | ICD-10-CM | POA: Diagnosis not present

## 2017-05-23 DIAGNOSIS — Z0000 Encounter for general adult medical examination without abnormal findings: Secondary | ICD-10-CM | POA: Diagnosis not present

## 2017-05-23 DIAGNOSIS — M5432 Sciatica, left side: Secondary | ICD-10-CM | POA: Diagnosis not present

## 2017-05-23 DIAGNOSIS — R002 Palpitations: Secondary | ICD-10-CM | POA: Diagnosis not present

## 2017-05-23 DIAGNOSIS — Z1231 Encounter for screening mammogram for malignant neoplasm of breast: Secondary | ICD-10-CM

## 2017-05-23 LAB — VITAMIN B12: VITAMIN B 12: 1892

## 2017-05-23 MED FILL — raNITIdine HCL 150 MG TABS: 150 | 30 days supply | Qty: 60 | Fill #0

## 2017-05-23 MED FILL — METHOCARBAMOL 500 MG TABS: 500 | 30 days supply | Qty: 60 | Fill #0

## 2017-05-24 DIAGNOSIS — M545 Low back pain: Secondary | ICD-10-CM | POA: Diagnosis not present

## 2017-05-24 DIAGNOSIS — M5432 Sciatica, left side: Secondary | ICD-10-CM | POA: Diagnosis not present

## 2017-05-28 DIAGNOSIS — M5432 Sciatica, left side: Secondary | ICD-10-CM | POA: Diagnosis not present

## 2017-05-28 DIAGNOSIS — M545 Low back pain: Secondary | ICD-10-CM | POA: Diagnosis not present

## 2017-05-31 DIAGNOSIS — M5432 Sciatica, left side: Secondary | ICD-10-CM | POA: Diagnosis not present

## 2017-05-31 DIAGNOSIS — M545 Low back pain: Secondary | ICD-10-CM | POA: Diagnosis not present

## 2017-06-03 DIAGNOSIS — M545 Low back pain: Secondary | ICD-10-CM | POA: Diagnosis not present

## 2017-06-03 DIAGNOSIS — M5432 Sciatica, left side: Secondary | ICD-10-CM | POA: Diagnosis not present

## 2017-06-06 ENCOUNTER — Other Ambulatory Visit (HOSPITAL_COMMUNITY): Payer: Self-pay | Admitting: Internal Medicine

## 2017-06-06 DIAGNOSIS — M5416 Radiculopathy, lumbar region: Secondary | ICD-10-CM

## 2017-06-06 DIAGNOSIS — R1011 Right upper quadrant pain: Secondary | ICD-10-CM

## 2017-06-11 DIAGNOSIS — M545 Low back pain: Secondary | ICD-10-CM | POA: Diagnosis not present

## 2017-06-11 DIAGNOSIS — M5432 Sciatica, left side: Secondary | ICD-10-CM | POA: Diagnosis not present

## 2017-06-14 ENCOUNTER — Ambulatory Visit
Admission: RE | Admit: 2017-06-14 | Discharge: 2017-06-14 | Disposition: A | Discharge status: Home / Self Care | Payer: 59 | Source: Ambulatory Visit | Attending: Internal Medicine | Admitting: Internal Medicine

## 2017-06-14 DIAGNOSIS — Z1231 Encounter for screening mammogram for malignant neoplasm of breast: Secondary | ICD-10-CM

## 2017-06-14 DIAGNOSIS — M545 Low back pain: Secondary | ICD-10-CM | POA: Diagnosis not present

## 2017-06-14 DIAGNOSIS — M5432 Sciatica, left side: Secondary | ICD-10-CM | POA: Diagnosis not present

## 2017-06-14 HISTORY — DX: Malignant neoplasm of colon, unspecified: C18.9

## 2017-06-14 LAB — HM MAMMOGRAPHY

## 2017-06-20 DIAGNOSIS — M545 Low back pain: Secondary | ICD-10-CM | POA: Diagnosis not present

## 2017-06-20 DIAGNOSIS — M5432 Sciatica, left side: Secondary | ICD-10-CM | POA: Diagnosis not present

## 2017-06-21 DIAGNOSIS — M545 Low back pain: Secondary | ICD-10-CM | POA: Diagnosis not present

## 2017-06-21 DIAGNOSIS — M5432 Sciatica, left side: Secondary | ICD-10-CM | POA: Diagnosis not present

## 2017-06-25 DIAGNOSIS — M545 Low back pain: Secondary | ICD-10-CM | POA: Diagnosis not present

## 2017-06-25 DIAGNOSIS — M5432 Sciatica, left side: Secondary | ICD-10-CM | POA: Diagnosis not present

## 2017-07-01 DIAGNOSIS — M5432 Sciatica, left side: Secondary | ICD-10-CM | POA: Diagnosis not present

## 2017-07-01 DIAGNOSIS — M545 Low back pain: Secondary | ICD-10-CM | POA: Diagnosis not present

## 2017-07-01 MED FILL — GABAPENTIN 100 MG CAP: 100 | 30 days supply | Qty: 30 | Fill #0

## 2017-07-02 ENCOUNTER — Ambulatory Visit: Payer: 59 | Admitting: Family Medicine

## 2017-07-02 MED FILL — CYCLOBENZAPRINE 10 MG TAB: 10 | 30 days supply | Qty: 30 | Fill #0

## 2017-07-03 MED FILL — HYDROCODON-APAP 5-325: 5-325 | 8 days supply | Qty: 30 | Fill #0

## 2017-07-04 DIAGNOSIS — M545 Low back pain: Secondary | ICD-10-CM | POA: Diagnosis not present

## 2017-07-04 DIAGNOSIS — M5432 Sciatica, left side: Secondary | ICD-10-CM | POA: Diagnosis not present

## 2017-07-04 DIAGNOSIS — M5117 Intervertebral disc disorders with radiculopathy, lumbosacral region: Secondary | ICD-10-CM | POA: Diagnosis not present

## 2017-07-09 DIAGNOSIS — M545 Low back pain: Secondary | ICD-10-CM | POA: Diagnosis not present

## 2017-07-09 DIAGNOSIS — M5117 Intervertebral disc disorders with radiculopathy, lumbosacral region: Secondary | ICD-10-CM | POA: Diagnosis not present

## 2017-07-09 DIAGNOSIS — M5432 Sciatica, left side: Secondary | ICD-10-CM | POA: Diagnosis not present

## 2017-07-11 MED FILL — predniSONE 20 MG TABS: 20 | 5 days supply | Qty: 15 | Fill #0

## 2017-07-12 ENCOUNTER — Ambulatory Visit (HOSPITAL_COMMUNITY)
Admission: RE | Admit: 2017-07-12 | Discharge: 2017-07-12 | Disposition: A | Discharge status: Home / Self Care | Payer: 59 | Source: Ambulatory Visit | Attending: Internal Medicine | Admitting: Internal Medicine

## 2017-07-12 DIAGNOSIS — R1011 Right upper quadrant pain: Secondary | ICD-10-CM | POA: Insufficient documentation

## 2017-07-12 DIAGNOSIS — R932 Abnormal findings on diagnostic imaging of liver and biliary tract: Secondary | ICD-10-CM | POA: Insufficient documentation

## 2017-07-12 DIAGNOSIS — K8020 Calculus of gallbladder without cholecystitis without obstruction: Secondary | ICD-10-CM | POA: Insufficient documentation

## 2017-07-13 ENCOUNTER — Ambulatory Visit (HOSPITAL_COMMUNITY)
Admission: RE | Admit: 2017-07-13 | Discharge: 2017-07-13 | Disposition: A | Discharge status: Home / Self Care | Payer: 59 | Source: Ambulatory Visit | Attending: Internal Medicine | Admitting: Internal Medicine

## 2017-07-13 DIAGNOSIS — M5127 Other intervertebral disc displacement, lumbosacral region: Secondary | ICD-10-CM | POA: Insufficient documentation

## 2017-07-13 DIAGNOSIS — M48061 Spinal stenosis, lumbar region without neurogenic claudication: Secondary | ICD-10-CM | POA: Insufficient documentation

## 2017-07-13 DIAGNOSIS — M5416 Radiculopathy, lumbar region: Secondary | ICD-10-CM | POA: Diagnosis not present

## 2017-07-13 MED ORDER — GADOBENATE DIMEGLUMINE 529 MG/ML IV SOLN
20.0000 mL | Freq: Once | INTRAVENOUS | Status: AC | PRN
  Administered 2017-07-13: 18 mL via INTRAVENOUS

## 2017-07-22 DIAGNOSIS — M545 Low back pain: Secondary | ICD-10-CM | POA: Diagnosis not present

## 2017-07-22 DIAGNOSIS — M5432 Sciatica, left side: Secondary | ICD-10-CM | POA: Diagnosis not present

## 2017-07-24 DIAGNOSIS — Z6833 Body mass index (BMI) 33.0-33.9, adult: Secondary | ICD-10-CM | POA: Diagnosis not present

## 2017-07-24 DIAGNOSIS — M545 Low back pain: Secondary | ICD-10-CM | POA: Diagnosis not present

## 2017-07-24 DIAGNOSIS — M5127 Other intervertebral disc displacement, lumbosacral region: Secondary | ICD-10-CM | POA: Diagnosis not present

## 2017-07-24 DIAGNOSIS — M5416 Radiculopathy, lumbar region: Secondary | ICD-10-CM | POA: Diagnosis not present

## 2017-07-24 DIAGNOSIS — R030 Elevated blood-pressure reading, without diagnosis of hypertension: Secondary | ICD-10-CM | POA: Diagnosis not present

## 2017-07-29 DIAGNOSIS — M545 Low back pain: Secondary | ICD-10-CM | POA: Diagnosis not present

## 2017-07-29 DIAGNOSIS — M5432 Sciatica, left side: Secondary | ICD-10-CM | POA: Diagnosis not present

## 2017-07-30 DIAGNOSIS — M4726 Other spondylosis with radiculopathy, lumbar region: Secondary | ICD-10-CM | POA: Diagnosis not present

## 2017-07-30 DIAGNOSIS — M5127 Other intervertebral disc displacement, lumbosacral region: Secondary | ICD-10-CM | POA: Diagnosis not present

## 2017-07-30 DIAGNOSIS — M5116 Intervertebral disc disorders with radiculopathy, lumbar region: Secondary | ICD-10-CM | POA: Diagnosis not present

## 2017-07-30 DIAGNOSIS — M4727 Other spondylosis with radiculopathy, lumbosacral region: Secondary | ICD-10-CM | POA: Diagnosis not present

## 2017-07-30 DIAGNOSIS — M5136 Other intervertebral disc degeneration, lumbar region: Secondary | ICD-10-CM | POA: Diagnosis not present

## 2017-07-30 DIAGNOSIS — M5117 Intervertebral disc disorders with radiculopathy, lumbosacral region: Secondary | ICD-10-CM | POA: Diagnosis not present

## 2017-07-30 MED FILL — HYDROCODON-APAP 5-325: 5-325 | 7 days supply | Qty: 60 | Fill #0

## 2017-07-30 MED FILL — METHOCARBAMOL 500 MG TABS: 500 | 15 days supply | Qty: 60 | Fill #0

## 2017-09-17 ENCOUNTER — Ambulatory Visit: Payer: 59 | Admitting: Family Medicine

## 2017-09-18 ENCOUNTER — Ambulatory Visit: Payer: 59 | Admitting: Family Medicine

## 2017-09-18 ENCOUNTER — Other Ambulatory Visit (INDEPENDENT_AMBULATORY_CARE_PROVIDER_SITE_OTHER): Payer: 59

## 2017-09-18 ENCOUNTER — Encounter: Payer: Self-pay | Admitting: Surgical

## 2017-09-18 ENCOUNTER — Encounter: Payer: Self-pay | Admitting: Family Medicine

## 2017-09-18 VITALS — BP 140/82 | HR 89 | Temp 98.9°F | Ht 63.0 in | Wt 196.4 lb

## 2017-09-18 DIAGNOSIS — E8881 Metabolic syndrome: Secondary | ICD-10-CM

## 2017-09-18 DIAGNOSIS — E7800 Pure hypercholesterolemia, unspecified: Secondary | ICD-10-CM | POA: Diagnosis not present

## 2017-09-18 DIAGNOSIS — Z9049 Acquired absence of other specified parts of digestive tract: Secondary | ICD-10-CM

## 2017-09-18 DIAGNOSIS — Z9889 Other specified postprocedural states: Secondary | ICD-10-CM | POA: Diagnosis not present

## 2017-09-18 DIAGNOSIS — N951 Menopausal and female climacteric states: Secondary | ICD-10-CM | POA: Diagnosis not present

## 2017-09-18 DIAGNOSIS — K760 Fatty (change of) liver, not elsewhere classified: Secondary | ICD-10-CM | POA: Diagnosis not present

## 2017-09-18 DIAGNOSIS — E669 Obesity, unspecified: Secondary | ICD-10-CM

## 2017-09-18 DIAGNOSIS — R945 Abnormal results of liver function studies: Secondary | ICD-10-CM | POA: Diagnosis not present

## 2017-09-18 DIAGNOSIS — M545 Low back pain, unspecified: Secondary | ICD-10-CM

## 2017-09-18 DIAGNOSIS — E538 Deficiency of other specified B group vitamins: Secondary | ICD-10-CM | POA: Diagnosis not present

## 2017-09-18 DIAGNOSIS — M79605 Pain in left leg: Secondary | ICD-10-CM

## 2017-09-18 DIAGNOSIS — E88819 Insulin resistance, unspecified: Secondary | ICD-10-CM

## 2017-09-18 DIAGNOSIS — R7989 Other specified abnormal findings of blood chemistry: Secondary | ICD-10-CM | POA: Insufficient documentation

## 2017-09-18 HISTORY — DX: Obesity, unspecified: E66.9

## 2017-09-18 HISTORY — DX: Menopausal and female climacteric states: N95.1

## 2017-09-18 HISTORY — DX: Fatty (change of) liver, not elsewhere classified: K76.0

## 2017-09-18 LAB — CBC WITH DIFFERENTIAL/PLATELET
Basophils Absolute: 0 10*3/uL (ref 0.0–0.1)
Basophils Relative: 0.6 % (ref 0.0–3.0)
Eosinophils Absolute: 0.1 10*3/uL (ref 0.0–0.7)
Eosinophils Relative: 1.5 % (ref 0.0–5.0)
HCT: 41.3 % (ref 36.0–46.0)
Hemoglobin: 14 g/dL (ref 12.0–15.0)
Lymphocytes Relative: 27.4 % (ref 12.0–46.0)
Lymphs Abs: 1.7 10*3/uL (ref 0.7–4.0)
MCHC: 34 g/dL (ref 30.0–36.0)
MCV: 90 fl (ref 78.0–100.0)
Monocytes Absolute: 0.4 10*3/uL (ref 0.1–1.0)
Monocytes Relative: 6.5 % (ref 3.0–12.0)
Neutro Abs: 3.9 10*3/uL (ref 1.4–7.7)
Neutrophils Relative %: 64 % (ref 43.0–77.0)
Platelets: 283 10*3/uL (ref 150.0–400.0)
RBC: 4.59 Mil/uL (ref 3.87–5.11)
RDW: 13.2 % (ref 11.5–15.5)
WBC: 6.1 10*3/uL (ref 4.0–10.5)

## 2017-09-18 LAB — COMPREHENSIVE METABOLIC PANEL
ALT: 22 U/L (ref 0–35)
AST: 15 U/L (ref 0–37)
Albumin: 4.4 g/dL (ref 3.5–5.2)
Alkaline Phosphatase: 80 U/L (ref 39–117)
BUN: 12 mg/dL (ref 6–23)
CO2: 27 mEq/L (ref 19–32)
Calcium: 9.7 mg/dL (ref 8.4–10.5)
Chloride: 103 mEq/L (ref 96–112)
Creatinine, Ser: 0.72 mg/dL (ref 0.40–1.20)
GFR: 90.97 mL/min (ref >60.00)
Glucose, Bld: 89 mg/dL (ref 70–99)
Potassium: 4 mEq/L (ref 3.5–5.1)
Sodium: 141 mEq/L (ref 135–145)
Total Bilirubin: 0.4 mg/dL (ref 0.2–1.2)
Total Protein: 6.8 g/dL (ref 6.0–8.3)

## 2017-09-18 LAB — LIPID PANEL
Cholesterol: 235 mg/dL — ABNORMAL HIGH (ref 0–200)
HDL: 47 mg/dL (ref >39.00)
NonHDL: 187.92
Total CHOL/HDL Ratio: 5
Triglycerides: 353 mg/dL — ABNORMAL HIGH (ref 0.0–149.0)
VLDL: 70.6 mg/dL — ABNORMAL HIGH (ref 0.0–40.0)

## 2017-09-18 LAB — HEMOGLOBIN A1C: Hgb A1c MFr Bld: 5.6 % (ref 4.6–6.5)

## 2017-09-18 LAB — VITAMIN B12: Vitamin B-12: 505 pg/mL (ref 211–911)

## 2017-09-18 LAB — LDL CHOLESTEROL, DIRECT: Direct LDL: 135 mg/dL

## 2017-09-18 MED ORDER — MELOXICAM 15 MG PO TABS: 15.0000 mg | ORAL_TABLET | Freq: Every day | ORAL | 0 refills | Status: DC

## 2017-09-18 MED FILL — MELOXICAM 15 MG TABLET: 15 | 30 days supply | Qty: 30 | Fill #0

## 2017-09-18 NOTE — Addendum Note (Signed)
Addended by: Francella Solian on: 09/18/2017 02:25 PM   Modules accepted: Orders

## 2017-09-18 NOTE — Addendum Note (Signed)
Addended by: Francella Solian on: 09/18/2017 02:21 PM   Modules accepted: Orders

## 2017-09-18 NOTE — Progress Notes (Signed)
Nicole Rosario is a 52 y.o. female is here to [place].   Patient Care Team: Briscoe Deutscher, DO as PCP - General (Family Medicine)   History of Present Illness:   Nicole Rosario CMA acting as scribe for Dr. Juleen China.  HPI: Patient comes in today to Establish care with Dr. Juleen China. She is currently a Furniture conservator/restorer. She is being followed yearly at [facility] for Hx colon cancer s/p colectomy.   1. Insulin resistance. A1c 5.7.    2. Vasomotor symptoms due to menopause. Hot flashes. No menses since cancer.    3. Lumbar pain with radiation down left leg. Recent microdiscectomy at L5-S1. Just back to work and having pain at left low back with radiation to left thigh.   4. Fatty liver. [place] this year.   5. Elevated LFTs. With [place] that showed fatty liver. Fam Hx of primary biliary cirrhosis.    6. Pure hypercholesterolemia, refuses statin at this time. Previously on statin but not compliant.    Health Maintenance Due  Topic Date Due  . TETANUS/TDAP  04/12/1986  . PAP SMEAR  04/12/1988  . COLONOSCOPY  04/12/2017   Depression screen PHQ 2/9 09/18/2017  Decreased Interest 0  Down, Depressed, Hopeless 0  PHQ - 2 Score 0   PMHx, SurgHx, SocialHx, Medications, and Allergies were reviewed in the Visit Navigator and updated as appropriate.   Past Medical History:  Diagnosis Date  . Colon cancer ([place]) 2014-2015  . Colon cancer Stage III at splenic flexure; followed at [facility]; s/p surgery and chemo. 04/09/2012  . Elevated LFTs 09/18/2017  . Fatty liver 09/18/2017  . GERD (gastroesophageal reflux disease)   . Hyperlipidemia 10/08/2011  . Insulin resistance 09/18/2017  . Obesity (BMI 30-39.9) 09/18/2017  . Seasonal allergies 10/08/2011  . Vasomotor symptoms due to menopause 09/18/2017     Past Surgical History:  Procedure Laterality Date  . COLON SURGERY    . MICRODISCECTOMY LUMBAR  2019     Family History  Problem Relation Age of Onset  . Heart disease Mother   . Hypertension Father   .  Cancer Maternal Grandmother   . Heart disease Maternal Grandfather   . Cancer Paternal Grandmother     Social History   Tobacco Use  . Smoking status: Former Research scientist (life sciences)  . Smokeless tobacco: Never Used  Substance Use Topics  . Alcohol use: Yes    Alcohol/week: 0.0 oz    Comment: social  . Drug use: Never    Current Medications and Allergies:   Current Outpatient Medications:  .  methocarbamol (ROBAXIN) 500 MG tablet, Take 1-2 tablets (500-1,000 mg total) by mouth 4 (four) times daily., Disp: 60 tablet, Rfl: 0 .  omeprazole (PRILOSEC) 20 MG capsule, Take 20 mg by mouth daily., Disp: , Rfl:  .  aspirin EC 81 MG tablet, Take 1 tablet by mouth daily., Disp: , Rfl:  .  Cholecalciferol (VITAMIN D3) 50000 units TABS, Take 1 tablet by mouth daily., Disp: , Rfl:  .  meloxicam (MOBIC) 15 MG tablet, Take 1 tablet (15 mg total) by mouth daily., Disp: 30 tablet, Rfl: 0   Allergies  Allergen Reactions  . Erythromycin Nausea Only and Nausea And Vomiting   Review of Systems:   Pertinent items are noted in the HPI. Otherwise, ROS is negative.  Vitals:   Vitals:   09/18/17 0735  Pulse: 89  Temp: 98.9 F (37.2 C)  TempSrc: Oral  SpO2: 96%  Weight: 196 lb 6.4 oz (89.1 kg)  Height: 5\' 3"  (1.6 m)     Body mass index is 34.79 kg/m.  Physical Exam:   Physical Exam  Constitutional: She is oriented to person, place, and time. She appears well-developed and well-nourished. No distress.  HENT:  Head: Normocephalic and atraumatic.  Right Ear: External ear normal.  Left Ear: External ear normal.  Nose: Nose normal.  Mouth/Throat: Oropharynx is clear and moist.  Eyes: Pupils are equal, round, and reactive to light. Conjunctivae and EOM are normal.  Neck: Normal range of motion. Neck supple. No thyromegaly present.  Cardiovascular: Normal rate, regular rhythm, normal heart sounds and intact distal pulses.  Pulmonary/Chest: Effort normal and breath sounds normal.  Abdominal: Soft. Bowel  sounds are normal.  Lymphadenopathy:    She has no cervical adenopathy.  Neurological: She is alert and oriented to person, place, and time.  Skin: Skin is warm and dry. Capillary refill takes less than 2 seconds.  Psychiatric: She has a normal mood and affect. Her behavior is normal.  Nursing note and vitals reviewed.  Assessment and Plan:   [name] was seen today for establish care.  Diagnoses and all orders for this visit:  Insulin resistance  Vasomotor symptoms due to menopause  Lumbar pain with radiation down left leg -     meloxicam (MOBIC) 15 MG tablet; Take 1 tablet (15 mg total) by mouth daily.  Hx of microdiscectomy, L5-S1  Hx of left hemicolectomy  Fatty liver -     Comprehensive metabolic panel -     Lipid panel  Obesity (BMI 30-39.9)  Elevated LFTs -     Mitochondrial antibodies  B12 deficiency -     CBC with Differential/Platelet -     Vitamin B12  Pure hypercholesterolemia, refuses statin at this time   . Reviewed expectations re: course of current medical issues. . Discussed self-management of symptoms. . Outlined signs and symptoms indicating need for more acute intervention. . Patient verbalized understanding and all questions were answered. Marland Kitchen Health Maintenance issues including appropriate healthy diet, exercise, and smoking avoidance were discussed with patient. . See orders for this visit as documented in the electronic medical record. . Patient received an After Visit Summary.  CMA served as Education administrator during this visit. History, Physical, and Plan performed by medical provider. The above documentation has been reviewed and is accurate and complete. Briscoe Deutscher, D.O.  Briscoe Deutscher, DO [place], [facility] 09/18/2017

## 2017-09-19 LAB — MITOCHONDRIAL ANTIBODIES: Mitochondrial M2 Ab, IgG: <=20 U

## 2017-09-19 MED FILL — METHOCARBAMOL 500 MG TABS: 500 | 30 days supply | Qty: 60 | Fill #1

## 2017-09-19 MED FILL — CYCLOBENZAPRINE 10 MG TAB: 10 | 30 days supply | Qty: 30 | Fill #1

## 2017-09-26 LAB — FSH/LH: FSH: 52.5

## 2017-09-26 LAB — T4, FREE: Free T4: 1.19

## 2017-10-03 ENCOUNTER — Telehealth: Payer: 59 | Admitting: Family

## 2017-10-03 DIAGNOSIS — J329 Chronic sinusitis, unspecified: Secondary | ICD-10-CM | POA: Diagnosis not present

## 2017-10-03 DIAGNOSIS — B9689 Other specified bacterial agents as the cause of diseases classified elsewhere: Secondary | ICD-10-CM | POA: Diagnosis not present

## 2017-10-03 MED ORDER — AMOXICILLIN-POT CLAVULANATE 875-125 MG PO TABS: 1.0000 | ORAL_TABLET | Freq: Two times a day (BID) | ORAL | 0 refills | Status: AC

## 2017-10-03 MED FILL — AMOX-CLAV 875-125 MG TABLET: 875-125 | 7 days supply | Qty: 14 | Fill #0

## 2017-10-03 NOTE — Progress Notes (Signed)

## 2017-10-28 DIAGNOSIS — Z85038 Personal history of other malignant neoplasm of large intestine: Secondary | ICD-10-CM | POA: Diagnosis not present

## 2017-10-28 DIAGNOSIS — Z9049 Acquired absence of other specified parts of digestive tract: Secondary | ICD-10-CM | POA: Diagnosis not present

## 2017-10-28 DIAGNOSIS — Z9221 Personal history of antineoplastic chemotherapy: Secondary | ICD-10-CM | POA: Diagnosis not present

## 2017-10-28 DIAGNOSIS — C187 Malignant neoplasm of sigmoid colon: Secondary | ICD-10-CM | POA: Diagnosis not present

## 2017-10-28 DIAGNOSIS — Z08 Encounter for follow-up examination after completed treatment for malignant neoplasm: Secondary | ICD-10-CM | POA: Diagnosis not present

## 2017-10-28 DIAGNOSIS — C189 Malignant neoplasm of colon, unspecified: Secondary | ICD-10-CM | POA: Diagnosis not present

## 2017-11-19 DIAGNOSIS — Z452 Encounter for adjustment and management of vascular access device: Secondary | ICD-10-CM | POA: Diagnosis not present

## 2017-11-19 DIAGNOSIS — C189 Malignant neoplasm of colon, unspecified: Secondary | ICD-10-CM | POA: Diagnosis not present

## 2017-11-20 ENCOUNTER — Ambulatory Visit (INDEPENDENT_AMBULATORY_CARE_PROVIDER_SITE_OTHER): Payer: 59 | Admitting: Family Medicine

## 2017-11-20 ENCOUNTER — Ambulatory Visit (INDEPENDENT_AMBULATORY_CARE_PROVIDER_SITE_OTHER): Payer: 59

## 2017-11-20 ENCOUNTER — Encounter: Payer: Self-pay | Admitting: Family Medicine

## 2017-11-20 VITALS — BP 136/88 | HR 90 | Temp 98.7°F | Ht 63.0 in | Wt 195.8 lb

## 2017-11-20 DIAGNOSIS — M19071 Primary osteoarthritis, right ankle and foot: Secondary | ICD-10-CM | POA: Diagnosis not present

## 2017-11-20 DIAGNOSIS — M79671 Pain in right foot: Secondary | ICD-10-CM | POA: Diagnosis not present

## 2017-11-20 NOTE — Progress Notes (Signed)
   Nicole Rosario is a 52 y.o. female here for an acute visit.  History of Present Illness:   HPI:   Nicole Rosario is a 52 y.o. female who presents with right foot pain. Onset of the symptoms was a week ago. Precipitating event: increased activity. Current symptoms include: ability to bear weight, but with some pain and swelling. Aggravating factors: any weight bearing. Symptoms have stabilized. Patient has had no prior foot problems. Evaluation to date: none. Treatment to date: elastic supporter which is somewhat effective, ice and rest.  PMHx, SurgHx, SocialHx, Medications, and Allergies were reviewed in the Visit Navigator and updated as appropriate.  Current Medications:   .  aspirin EC 81 MG tablet, Take 1 tablet by mouth daily., Disp: , Rfl:  .  Cholecalciferol (VITAMIN D3) 50000 units TABS, Take 1 tablet by mouth daily., Disp: , Rfl:  .  omeprazole (PRILOSEC) 20 MG capsule, Take 20 mg by mouth daily., Disp: , Rfl:    Allergies  Allergen Reactions  . Erythromycin Nausea Only and Nausea And Vomiting   Review of Systems:   Pertinent items are noted in the HPI. Otherwise, ROS is negative.  Vitals:   Vitals:   11/20/17 0929  BP: 136/88  Pulse: 90  Temp: 98.7 F (37.1 C)  TempSrc: Oral  SpO2: 98%  Weight: 195 lb 12.8 oz (88.8 kg)  Height: 5\' 3"  (1.6 m)     Body mass index is 34.68 kg/m.  Physical Exam:   Physical Exam  Constitutional: She appears well-developed and well-nourished.  HENT:  Head: Normocephalic and atraumatic.  Eyes: Pupils are equal, round, and reactive to light. Conjunctivae and EOM are normal.  Cardiovascular: Normal rate.  Pulmonary/Chest: Effort normal.  Musculoskeletal:       Right foot: There is tenderness.       Feet:  Skin: Skin is warm and dry.   Assessment and Plan:   Diagnoses and all orders for this visit:  Right foot pain Comments: Xray without acute bony findings. Will send for over read. Post op shoe today. Orders: -     DG Foot  Complete Right; Future   . Reviewed expectations re: course of current medical issues. . Discussed self-management of symptoms. . Outlined signs and symptoms indicating need for more acute intervention. . Patient verbalized understanding and all questions were answered. Marland Kitchen Health Maintenance issues including appropriate healthy diet, exercise, and smoking avoidance were discussed with patient. . See orders for this visit as documented in the electronic medical record. . Patient received an After Visit Summary.   Briscoe Deutscher, DO [place], [facility] 11/20/2017

## 2017-12-23 ENCOUNTER — Ambulatory Visit: Payer: 59 | Admitting: Family Medicine

## 2018-01-02 NOTE — Progress Notes (Signed)
Nicole Rosario is a 52 y.o. female is here for follow up.  History of Present Illness:   HPI:   HPI: Patient comes in today to for follow up. She is currently a Furniture conservator/restorer. She is being followed yearly at [facility] for Hx colon cancer s/p colectomy.   1. Insulin resistance. Stable. Watching diet. No regular exercise.  Lab Results  Component Value Date   HGBA1C 5.6 01/03/2018    2. Vasomotor symptoms due to menopause. Hot flashes. No menses since cancer.    3. Lumbar pain with radiation down left leg. Hx of microdiscectomy at L5-S1. Recent foot injury and finally healed.   4. Fatty liver. [place] this year.  IMPRESSION: 1. Cholelithiasis. No gallbladder wall thickening or pericholecystic fluid.  2. Increase in liver echogenicity, a finding indicative of hepatic steatosis. While no focal liver lesions are evident, it must be cautioned that the sensitivity of ultrasound for detection of focal liver lesions is diminished in this circumstance.   5. Elevated LFTs. With [place] that showed fatty liver. Fam Hx of primary biliary cirrhosis.    6. Pure hypercholesterolemia, refuses statin at this time. Previously on statin but not compliant.    Also, she has additional complaints of cough > 1 week. Worsening. No fever or wheeze. Nonsmoker.  Will get flu shot at work.  Health Maintenance Due  Topic Date Due  . PAP SMEAR  04/12/1988  . TETANUS/TDAP  04/09/2013  . INFLUENZA VACCINE  11/07/2017   Depression screen PHQ 2/9 09/18/2017  Decreased Interest 0  Down, Depressed, Hopeless 0  PHQ - 2 Score 0   PMHx, SurgHx, SocialHx, FamHx, Medications, and Allergies were reviewed in the Visit Navigator and updated as appropriate.   Patient Active Problem List   Diagnosis Date Noted  . Elevated LFTs 09/18/2017  . Obesity (BMI 30-39.9) 09/18/2017  . Fatty liver 09/18/2017  . Hx of left hemicolectomy 09/18/2017  . Hx of microdiscectomy, L5-S1 09/18/2017  . Lumbar pain with radiation down  left leg 09/18/2017  . Vasomotor symptoms due to menopause 09/18/2017  . Insulin resistance 09/18/2017  . H/O colon cancer, stage III 09/05/2014  . GERD (gastroesophageal reflux disease) 07/03/2012  . Hyperlipidemia 10/08/2011  . Seasonal allergies 10/08/2011   Social History   Tobacco Use  . Smoking status: Former Research scientist (life sciences)  . Smokeless tobacco: Never Used  Substance Use Topics  . Alcohol use: Yes    Alcohol/week: 0.0 standard drinks    Comment: social  . Drug use: Never   Current Medications and Allergies:   .  aspirin EC 81 MG tablet, Take 1 tablet by mouth daily., Disp: , Rfl:  .  Cholecalciferol (VITAMIN D3) 50000 units TABS, Take 1 tablet by mouth daily., Disp: , Rfl:  .  omeprazole (PRILOSEC) 20 MG capsule, Take 20 mg by mouth daily., Disp: , Rfl:    Allergies  Allergen Reactions  . Erythromycin Nausea Only and Nausea And Vomiting   Review of Systems   Pertinent items are noted in the HPI. Otherwise, ROS is negative.  Vitals:   Vitals:   01/03/18 1021  BP: 140/80  Pulse: 96  Temp: 99 F (37.2 C)  TempSrc: Oral  SpO2: 96%  Weight: 197 lb (89.4 kg)  Height: 5\' 3"  (1.6 m)     Body mass index is 34.9 kg/m.  Physical Exam:   Physical Exam  Constitutional: She is oriented to person, place, and time. She appears well-developed and well-nourished. No distress.  HENT:  Head: Normocephalic and atraumatic.  Right Ear: External ear normal.  Left Ear: External ear normal.  Nose: Nose normal.  Mouth/Throat: Oropharynx is clear and moist.  Eyes: Pupils are equal, round, and reactive to light. Conjunctivae and EOM are normal.  Neck: Normal range of motion. Neck supple. No thyromegaly present.  Cardiovascular: Normal rate, regular rhythm, normal heart sounds and intact distal pulses.  Pulmonary/Chest: Effort normal. She has rhonchi.  Abdominal: Soft. Bowel sounds are normal.  Lymphadenopathy:    She has no cervical adenopathy.  Neurological: She is alert and  oriented to person, place, and time.  Skin: Skin is warm and dry. Capillary refill takes less than 2 seconds.  Psychiatric: She has a normal mood and affect. Her behavior is normal.  Nursing note and vitals reviewed.  Assessment and Plan:   Nicole Rosario was seen today for follow-up and cough.  Diagnoses and all orders for this visit:  Cough Comments: Reviewed red flags. Safety net Rx provided.  Orders: -     predniSONE (DELTASONE) 5 MG tablet; 6-5-4-3-2-1-off -     cefdinir (OMNICEF) 300 MG capsule; Take 1 capsule (300 mg total) by mouth 2 (two) times daily.  Insulin resistance Comments: Stable. Orders: -     POCT glycosylated hemoglobin (Hb A1C) -     POCT glycosylated hemoglobin (Hb A1C)  Gastroesophageal reflux disease without esophagitis  Elevated LFTs  Obesity (BMI 30-39.9)   . Reviewed expectations re: course of current medical issues. . Discussed self-management of symptoms. . Outlined signs and symptoms indicating need for more acute intervention. . Patient verbalized understanding and all questions were answered. Marland Kitchen Health Maintenance issues including appropriate healthy diet, exercise, and smoking avoidance were discussed with patient. . See orders for this visit as documented in the electronic medical record. . Patient received an After Visit Summary.  Briscoe Deutscher, DO [place], [facility] 01/05/2018

## 2018-01-03 ENCOUNTER — Ambulatory Visit: Payer: 59 | Admitting: Family Medicine

## 2018-01-03 ENCOUNTER — Encounter: Payer: Self-pay | Admitting: Family Medicine

## 2018-01-03 VITALS — BP 140/80 | HR 96 | Temp 99.0°F | Ht 63.0 in | Wt 197.0 lb

## 2018-01-03 DIAGNOSIS — E669 Obesity, unspecified: Secondary | ICD-10-CM

## 2018-01-03 DIAGNOSIS — E88819 Insulin resistance, unspecified: Secondary | ICD-10-CM

## 2018-01-03 DIAGNOSIS — R05 Cough: Secondary | ICD-10-CM | POA: Diagnosis not present

## 2018-01-03 DIAGNOSIS — K219 Gastro-esophageal reflux disease without esophagitis: Secondary | ICD-10-CM

## 2018-01-03 DIAGNOSIS — E8881 Metabolic syndrome: Secondary | ICD-10-CM

## 2018-01-03 DIAGNOSIS — R945 Abnormal results of liver function studies: Secondary | ICD-10-CM

## 2018-01-03 DIAGNOSIS — R7989 Other specified abnormal findings of blood chemistry: Secondary | ICD-10-CM

## 2018-01-03 DIAGNOSIS — R059 Cough, unspecified: Secondary | ICD-10-CM

## 2018-01-03 LAB — POCT GLYCOSYLATED HEMOGLOBIN (HGB A1C): Hemoglobin A1C: 5.6 % (ref 4.0–5.6)

## 2018-01-03 MED ORDER — PREDNISONE 5 MG PO TABS: ORAL_TABLET | ORAL | 0 refills | Status: DC

## 2018-01-03 MED ORDER — CEFDINIR 300 MG PO CAPS: 300.0000 mg | ORAL_CAPSULE | Freq: Two times a day (BID) | ORAL | 0 refills | Status: DC

## 2018-01-03 MED FILL — predniSONE 5 MG (21) TBPK: 5 | 6 days supply | Qty: 21 | Fill #0

## 2018-01-10 MED FILL — CEFDINIR 300 MG CAPS: 300 | 10 days supply | Qty: 20 | Fill #0

## 2018-02-14 ENCOUNTER — Encounter: Payer: Self-pay | Admitting: Family Medicine

## 2018-02-14 ENCOUNTER — Telehealth: Payer: Self-pay | Admitting: Family Medicine

## 2018-02-14 NOTE — Telephone Encounter (Signed)
Copied from [place] [id number]. Topic: General - Inquiry >> Feb 14, 2018  7:43 AM Conception Chancy, NT wrote: Reason for CRM: patient is calling and states when she was seen in the office in August she was given a brace for her foot and paid for it in office because she was told it would be cheaper that way than it being filed and receiving a bill. Patient states she just received a bill from the company [employer] for $50 in regards to that brace. She would like a call back.

## 2018-02-14 NOTE — Telephone Encounter (Signed)
Called pt and she advised that she also contacted [employer] this morning and was told that they are able to see where it was paid by card in the office and they will remove the charges. I will follow-up with rep Ryan next week to ensure that charges/payments are corrected.

## 2018-02-17 NOTE — Telephone Encounter (Signed)
Spoke with [employer] and he will make sure that the issue is resolved. No further action needed at this time.

## 2018-03-20 DIAGNOSIS — H5213 Myopia, bilateral: Secondary | ICD-10-CM | POA: Diagnosis not present

## 2018-03-20 DIAGNOSIS — H524 Presbyopia: Secondary | ICD-10-CM | POA: Diagnosis not present

## 2018-03-20 DIAGNOSIS — H52223 Regular astigmatism, bilateral: Secondary | ICD-10-CM | POA: Diagnosis not present

## 2018-03-26 ENCOUNTER — Encounter: Payer: Self-pay | Admitting: Family Medicine

## 2018-03-26 ENCOUNTER — Ambulatory Visit: Payer: 59 | Admitting: Family Medicine

## 2018-03-26 VITALS — BP 140/86 | HR 68 | Temp 98.4°F | Ht 63.0 in | Wt 195.0 lb

## 2018-03-26 DIAGNOSIS — E7800 Pure hypercholesterolemia, unspecified: Secondary | ICD-10-CM

## 2018-03-26 DIAGNOSIS — R945 Abnormal results of liver function studies: Secondary | ICD-10-CM | POA: Diagnosis not present

## 2018-03-26 DIAGNOSIS — Z789 Other specified health status: Secondary | ICD-10-CM | POA: Diagnosis not present

## 2018-03-26 DIAGNOSIS — R7989 Other specified abnormal findings of blood chemistry: Secondary | ICD-10-CM

## 2018-03-26 LAB — LIPID PANEL
Cholesterol: 235 mg/dL — ABNORMAL HIGH (ref 0–200)
HDL: 47.5 mg/dL (ref >39.00)
NonHDL: 187.32
Total CHOL/HDL Ratio: 5
Triglycerides: 330 mg/dL — ABNORMAL HIGH (ref 0.0–149.0)
VLDL: 66 mg/dL — ABNORMAL HIGH (ref 0.0–40.0)

## 2018-03-26 LAB — COMPREHENSIVE METABOLIC PANEL
ALT: 30 U/L (ref 0–35)
AST: 20 U/L (ref 0–37)
Albumin: 4.4 g/dL (ref 3.5–5.2)
Alkaline Phosphatase: 79 U/L (ref 39–117)
BUN: 10 mg/dL (ref 6–23)
CO2: 27 mEq/L (ref 19–32)
Calcium: 9.4 mg/dL (ref 8.4–10.5)
Chloride: 105 mEq/L (ref 96–112)
Creatinine, Ser: 0.63 mg/dL (ref 0.40–1.20)
GFR: 105.91 mL/min (ref >60.00)
Glucose, Bld: 94 mg/dL (ref 70–99)
Potassium: 4 mEq/L (ref 3.5–5.1)
Sodium: 140 mEq/L (ref 135–145)
Total Bilirubin: 0.2 mg/dL (ref 0.2–1.2)
Total Protein: 6.8 g/dL (ref 6.0–8.3)

## 2018-03-26 LAB — LDL CHOLESTEROL, DIRECT: Direct LDL: 132 mg/dL

## 2018-03-26 MED ORDER — ROSUVASTATIN CALCIUM 10 MG PO TABS: 10.0000 mg | ORAL_TABLET | Freq: Every day | ORAL | 3 refills | Status: DC

## 2018-03-26 MED FILL — ROSUVASTATIN CALCIUM 10 MG: 10 | 90 days supply | Qty: 90 | Fill #0

## 2018-03-26 NOTE — Addendum Note (Signed)
Addended by: Briscoe Deutscher R on: 03/26/2018 04:53 PM   Modules accepted: Orders

## 2018-03-26 NOTE — Progress Notes (Signed)
Nicole Rosario is a 52 y.o. female is here for follow up on Lipids  History of Present Illness:   HPI:   Trying to exercise on a regular basis? No. Compliant with diet? No.  Lab Results  Component Value Date   CHOL 235 (H) 09/18/2017   HDL 47.00 09/18/2017   LDLCALC 111 04/12/2017   LDLDIRECT 135.0 09/18/2017   TRIG 353.0 (H) 09/18/2017   CHOLHDL 5 09/18/2017   Lab Results  Component Value Date   ALT 22 09/18/2017   AST 15 09/18/2017   ALKPHOS 80 09/18/2017   BILITOT 0.4 09/18/2017     There are no preventive care reminders to display for this patient.   Depression screen PHQ 2/9 09/18/2017  Decreased Interest 0  Down, Depressed, Hopeless 0  PHQ - 2 Score 0   PMHx, SurgHx, SocialHx, FamHx, Medications, and Allergies were reviewed in the Visit Navigator and updated as appropriate.   Patient Active Problem List   Diagnosis Date Noted  . Elevated LFTs 09/18/2017  . Obesity (BMI 30-39.9) 09/18/2017  . Fatty liver 09/18/2017  . Hx of left hemicolectomy 09/18/2017  . Hx of microdiscectomy, L5-S1 09/18/2017  . Lumbar pain with radiation down left leg 09/18/2017  . Vasomotor symptoms due to menopause 09/18/2017  . Insulin resistance 09/18/2017  . H/O colon cancer, stage III 09/05/2014  . GERD (gastroesophageal reflux disease) 07/03/2012  . Hyperlipidemia 10/08/2011  . Seasonal allergies 10/08/2011   Social History   Tobacco Use  . Smoking status: Former Research scientist (life sciences)  . Smokeless tobacco: Never Used  Substance Use Topics  . Alcohol use: Yes    Alcohol/week: 0.0 standard drinks    Comment: social  . Drug use: Never   Current Medications and Allergies:   Current Outpatient Medications:  .  aspirin EC 81 MG tablet, Take 1 tablet by mouth daily., Disp: , Rfl:  .  Cholecalciferol (VITAMIN D3) 50000 units TABS, Take 1 tablet by mouth daily., Disp: , Rfl:  .  omeprazole (PRILOSEC) 20 MG capsule, Take 20 mg by mouth daily., Disp: , Rfl:  .  ranitidine (ZANTAC) 150 MG tablet,  ranitidine 150 mg tablet, Disp: , Rfl:    Allergies  Allergen Reactions  . Erythromycin Nausea Only and Nausea And Vomiting   Review of Systems   Pertinent items are noted in the HPI. Otherwise, a complete ROS is negative.  Vitals:   Vitals:   03/26/18 0919  BP: 140/86  Pulse: 68  Temp: 98.4 F (36.9 C)  TempSrc: Oral  SpO2: 96%  Weight: 195 lb (88.5 kg)  Height: 5' 3"  (1.6 m)     Body mass index is 34.54 kg/m.  Physical Exam:   Physical Exam Vitals signs and nursing note reviewed.  HENT:     Head: Normocephalic and atraumatic.  Eyes:     Pupils: Pupils are equal, round, and reactive to light.  Neck:     Musculoskeletal: Normal range of motion and neck supple.  Cardiovascular:     Rate and Rhythm: Normal rate and regular rhythm.     Heart sounds: Normal heart sounds.  Pulmonary:     Effort: Pulmonary effort is normal.  Abdominal:     Palpations: Abdomen is soft.  Skin:    General: Skin is warm.  Psychiatric:        Behavior: Behavior normal.    Assessment and Plan:   [name] was seen today for hyperlipidemia.  Diagnoses and all orders for this visit:  Elevated  LFTs -     Comprehensive metabolic panel  Pure hypercholesterolemia -     Lipid panel  Measles, mumps, rubella (MMR) vaccination status unknown -     Measles/Mumps/Rubella Immunity   . Orders and follow up as documented in [place], reviewed diet, exercise and weight control, cardiovascular risk and specific lipid/LDL goals reviewed, reviewed medications and side effects in detail.  . Reviewed expectations re: course of current medical issues. . Outlined signs and symptoms indicating need for more acute intervention. . Patient verbalized understanding and all questions were answered. . Patient received an After Visit Summary.  Briscoe Deutscher, DO [place], [facility] 03/26/2018

## 2018-03-27 LAB — MEASLES/MUMPS/RUBELLA IMMUNITY
Mumps IgG: 48.9 AU/mL
Rubella: 1.75 index
Rubeola IgG: 75.6 AU/mL

## 2018-05-05 ENCOUNTER — Telehealth: Payer: Self-pay | Admitting: Family Medicine

## 2018-05-05 NOTE — Telephone Encounter (Signed)
You can put pt in same day that is fine. Can you call and take care of that?

## 2018-05-05 NOTE — Telephone Encounter (Signed)
Please advise.   Copied from [place] [id number]. Topic: Appointment Scheduling - Scheduling Inquiry for Clinic >> May 05, 2018  1:41 PM Sheran Luz wrote: Reason for CRM: Patient is requesting to be worked in with Dr. Juleen China on Monday 02/03 for recurrent sinus issues/sore throat. Same day appointments available, unable to schedule. Please advise.

## 2018-05-06 NOTE — Telephone Encounter (Signed)
Called pt and she stated she has already made an appt with ENT and did not need to make an appt with Juleen China at this time.

## 2018-05-12 DIAGNOSIS — K111 Hypertrophy of salivary gland: Secondary | ICD-10-CM | POA: Diagnosis not present

## 2018-05-12 DIAGNOSIS — Z87891 Personal history of nicotine dependence: Secondary | ICD-10-CM | POA: Diagnosis not present

## 2018-05-13 ENCOUNTER — Other Ambulatory Visit (HOSPITAL_COMMUNITY): Payer: Self-pay | Admitting: Otolaryngology

## 2018-05-13 DIAGNOSIS — K111 Hypertrophy of salivary gland: Secondary | ICD-10-CM

## 2018-05-19 ENCOUNTER — Ambulatory Visit (HOSPITAL_COMMUNITY)
Admission: RE | Admit: 2018-05-19 | Discharge: 2018-05-19 | Disposition: A | Discharge status: Home / Self Care | Payer: 59 | Source: Ambulatory Visit | Attending: Otolaryngology | Admitting: Otolaryngology

## 2018-05-19 DIAGNOSIS — J029 Acute pharyngitis, unspecified: Secondary | ICD-10-CM | POA: Diagnosis not present

## 2018-05-19 DIAGNOSIS — K111 Hypertrophy of salivary gland: Secondary | ICD-10-CM | POA: Insufficient documentation

## 2018-05-19 MED ORDER — IOHEXOL 300 MG/ML  SOLN
75.0000 mL | Freq: Once | INTRAMUSCULAR | Status: AC | PRN
  Administered 2018-05-19: 75 mL via INTRAVENOUS

## 2018-06-02 ENCOUNTER — Other Ambulatory Visit: Payer: Self-pay | Admitting: Otolaryngology

## 2018-06-02 ENCOUNTER — Other Ambulatory Visit (HOSPITAL_COMMUNITY): Payer: Self-pay | Admitting: Otolaryngology

## 2018-06-02 DIAGNOSIS — K118 Other diseases of salivary glands: Secondary | ICD-10-CM | POA: Insufficient documentation

## 2018-06-02 DIAGNOSIS — Z85038 Personal history of other malignant neoplasm of large intestine: Secondary | ICD-10-CM | POA: Diagnosis not present

## 2018-06-02 DIAGNOSIS — G501 Atypical facial pain: Secondary | ICD-10-CM

## 2018-06-02 MED FILL — ALPRAZolam 0.25 MG TABS: 0.25 | 1 days supply | Qty: 3 | Fill #0

## 2018-06-04 MED FILL — CYCLOBENZAPRINE HCL 10 MG T: 10 | 3 days supply | Qty: 10 | Fill #0

## 2018-06-12 ENCOUNTER — Other Ambulatory Visit: Payer: Self-pay | Admitting: Family Medicine

## 2018-06-12 DIAGNOSIS — Z1231 Encounter for screening mammogram for malignant neoplasm of breast: Secondary | ICD-10-CM

## 2018-06-13 ENCOUNTER — Encounter: Payer: 59 | Admitting: Family Medicine

## 2018-06-14 ENCOUNTER — Ambulatory Visit
Admission: RE | Admit: 2018-06-14 | Discharge: 2018-06-14 | Disposition: A | Discharge status: Home / Self Care | Payer: 59 | Source: Ambulatory Visit | Attending: Otolaryngology | Admitting: Otolaryngology

## 2018-06-14 DIAGNOSIS — Z85038 Personal history of other malignant neoplasm of large intestine: Secondary | ICD-10-CM

## 2018-06-14 DIAGNOSIS — G501 Atypical facial pain: Secondary | ICD-10-CM

## 2018-06-14 DIAGNOSIS — R51 Headache: Secondary | ICD-10-CM | POA: Diagnosis not present

## 2018-06-14 DIAGNOSIS — K118 Other diseases of salivary glands: Secondary | ICD-10-CM

## 2018-06-14 MED ORDER — GADOBENATE DIMEGLUMINE 529 MG/ML IV SOLN
18.0000 mL | Freq: Once | INTRAVENOUS | Status: AC | PRN
  Administered 2018-06-14: 18 mL via INTRAVENOUS

## 2018-06-19 ENCOUNTER — Encounter: Payer: 59 | Admitting: Internal Medicine

## 2018-09-03 ENCOUNTER — Encounter: Payer: 59 | Admitting: Family Medicine

## 2018-09-04 MED FILL — METHYLPREDNISOLONE 4 MG TAB: 4 | 6 days supply | Qty: 21 | Fill #0

## 2018-09-18 ENCOUNTER — Ambulatory Visit
Admission: RE | Admit: 2018-09-18 | Discharge: 2018-09-18 | Disposition: A | Discharge status: Home / Self Care | Payer: 59 | Source: Ambulatory Visit | Attending: Family Medicine | Admitting: Family Medicine

## 2018-09-18 ENCOUNTER — Other Ambulatory Visit: Payer: Self-pay

## 2018-09-18 DIAGNOSIS — Z1231 Encounter for screening mammogram for malignant neoplasm of breast: Secondary | ICD-10-CM

## 2018-10-17 DIAGNOSIS — Z01812 Encounter for preprocedural laboratory examination: Secondary | ICD-10-CM | POA: Diagnosis not present

## 2018-10-17 DIAGNOSIS — Z1159 Encounter for screening for other viral diseases: Secondary | ICD-10-CM | POA: Diagnosis not present

## 2018-10-20 ENCOUNTER — Encounter: Payer: 59 | Admitting: Family Medicine

## 2018-10-20 DIAGNOSIS — Z87891 Personal history of nicotine dependence: Secondary | ICD-10-CM | POA: Diagnosis not present

## 2018-10-20 DIAGNOSIS — D649 Anemia, unspecified: Secondary | ICD-10-CM | POA: Diagnosis not present

## 2018-10-20 DIAGNOSIS — E7800 Pure hypercholesterolemia, unspecified: Secondary | ICD-10-CM | POA: Diagnosis not present

## 2018-10-20 DIAGNOSIS — Z1211 Encounter for screening for malignant neoplasm of colon: Secondary | ICD-10-CM | POA: Diagnosis not present

## 2018-10-20 DIAGNOSIS — K219 Gastro-esophageal reflux disease without esophagitis: Secondary | ICD-10-CM | POA: Diagnosis not present

## 2018-10-20 DIAGNOSIS — Z85038 Personal history of other malignant neoplasm of large intestine: Secondary | ICD-10-CM | POA: Diagnosis not present

## 2018-10-20 DIAGNOSIS — Z9049 Acquired absence of other specified parts of digestive tract: Secondary | ICD-10-CM | POA: Diagnosis not present

## 2018-10-20 DIAGNOSIS — Z9221 Personal history of antineoplastic chemotherapy: Secondary | ICD-10-CM | POA: Diagnosis not present

## 2018-10-20 DIAGNOSIS — Z780 Asymptomatic menopausal state: Secondary | ICD-10-CM | POA: Diagnosis not present

## 2018-10-20 LAB — HM COLONOSCOPY

## 2018-10-22 ENCOUNTER — Ambulatory Visit (INDEPENDENT_AMBULATORY_CARE_PROVIDER_SITE_OTHER): Payer: 59 | Admitting: Family Medicine

## 2018-10-22 ENCOUNTER — Encounter: Payer: Self-pay | Admitting: Family Medicine

## 2018-10-22 ENCOUNTER — Other Ambulatory Visit: Payer: Self-pay

## 2018-10-22 VITALS — BP 120/76 | HR 104 | Temp 98.7°F | Ht 63.0 in | Wt 189.0 lb

## 2018-10-22 DIAGNOSIS — E669 Obesity, unspecified: Secondary | ICD-10-CM

## 2018-10-22 DIAGNOSIS — E8881 Metabolic syndrome: Secondary | ICD-10-CM

## 2018-10-22 DIAGNOSIS — K219 Gastro-esophageal reflux disease without esophagitis: Secondary | ICD-10-CM | POA: Diagnosis not present

## 2018-10-22 DIAGNOSIS — E7800 Pure hypercholesterolemia, unspecified: Secondary | ICD-10-CM | POA: Diagnosis not present

## 2018-10-22 DIAGNOSIS — R7989 Other specified abnormal findings of blood chemistry: Secondary | ICD-10-CM

## 2018-10-22 DIAGNOSIS — R945 Abnormal results of liver function studies: Secondary | ICD-10-CM | POA: Diagnosis not present

## 2018-10-22 DIAGNOSIS — Z9049 Acquired absence of other specified parts of digestive tract: Secondary | ICD-10-CM | POA: Diagnosis not present

## 2018-10-22 DIAGNOSIS — K760 Fatty (change of) liver, not elsewhere classified: Secondary | ICD-10-CM | POA: Diagnosis not present

## 2018-10-22 DIAGNOSIS — E88819 Insulin resistance, unspecified: Secondary | ICD-10-CM

## 2018-10-22 DIAGNOSIS — Z85038 Personal history of other malignant neoplasm of large intestine: Secondary | ICD-10-CM | POA: Diagnosis not present

## 2018-10-22 LAB — CBC WITH DIFFERENTIAL/PLATELET
Basophils Absolute: 0.1 10*3/uL (ref 0.0–0.1)
Basophils Relative: 1.1 % (ref 0.0–3.0)
Eosinophils Absolute: 0.2 10*3/uL (ref 0.0–0.7)
Eosinophils Relative: 3.7 % (ref 0.0–5.0)
HCT: 40.8 % (ref 36.0–46.0)
Hemoglobin: 13.9 g/dL (ref 12.0–15.0)
Lymphocytes Relative: 34.6 % (ref 12.0–46.0)
Lymphs Abs: 1.7 10*3/uL (ref 0.7–4.0)
MCHC: 34.1 g/dL (ref 30.0–36.0)
MCV: 88.7 fl (ref 78.0–100.0)
Monocytes Absolute: 0.4 10*3/uL (ref 0.1–1.0)
Monocytes Relative: 8.5 % (ref 3.0–12.0)
Neutro Abs: 2.6 10*3/uL (ref 1.4–7.7)
Neutrophils Relative %: 52.1 % (ref 43.0–77.0)
Platelets: 244 10*3/uL (ref 150.0–400.0)
RBC: 4.6 Mil/uL (ref 3.87–5.11)
RDW: 13 % (ref 11.5–15.5)
WBC: 5 10*3/uL (ref 4.0–10.5)

## 2018-10-22 LAB — COMPREHENSIVE METABOLIC PANEL
ALT: 39 U/L — ABNORMAL HIGH (ref 0–35)
AST: 23 U/L (ref 0–37)
Albumin: 4.6 g/dL (ref 3.5–5.2)
Alkaline Phosphatase: 92 U/L (ref 39–117)
BUN: 9 mg/dL (ref 6–23)
CO2: 26 mEq/L (ref 19–32)
Calcium: 9.6 mg/dL (ref 8.4–10.5)
Chloride: 108 mEq/L (ref 96–112)
Creatinine, Ser: 0.63 mg/dL (ref 0.40–1.20)
GFR: 99.42 mL/min (ref >60.00)
Glucose, Bld: 116 mg/dL — ABNORMAL HIGH (ref 70–99)
Potassium: 3.7 mEq/L (ref 3.5–5.1)
Sodium: 143 mEq/L (ref 135–145)
Total Bilirubin: 0.3 mg/dL (ref 0.2–1.2)
Total Protein: 6.8 g/dL (ref 6.0–8.3)

## 2018-10-22 LAB — LIPID PANEL
Cholesterol: 183 mg/dL (ref 0–200)
HDL: 46.4 mg/dL (ref >39.00)
NonHDL: 137.08
Total CHOL/HDL Ratio: 4
Triglycerides: 272 mg/dL — ABNORMAL HIGH (ref 0.0–149.0)
VLDL: 54.4 mg/dL — ABNORMAL HIGH (ref 0.0–40.0)

## 2018-10-22 LAB — LDL CHOLESTEROL, DIRECT: Direct LDL: 107 mg/dL

## 2018-10-22 LAB — TSH: TSH: 3.6 u[IU]/mL (ref 0.35–4.50)

## 2018-10-22 NOTE — Progress Notes (Signed)
Subjective:    Nicole Rosario is a 53 y.o. female and is here for a comprehensive physical exam. Doing well. Working in yard, swimming a lot, walking on treadmill for 20 minutes 3 times per week. Takes Robaxin prn daytime for muscle spasms, Flexeril prn evening for muscle spasms (works better but more sedating). Compliant with other medications. Crestor 3-4 times per week. No myalgias. Due for PAP - will make appointment with GYN. Recent colonoscopy at [facility]. Looked good, due back in 3 years. Will see Oncology for follow up next week. Will add CEA to labs today.   There are no preventive care reminders to display for this patient.  Current Outpatient Medications:  .  aspirin EC 81 MG tablet, Take 1 tablet by mouth daily., Disp: , Rfl:  .  Cholecalciferol (VITAMIN D3) 50000 units TABS, Take 1 tablet by mouth daily., Disp: , Rfl:  .  cyclobenzaprine (FLEXERIL) 10 MG tablet, Take 10 mg by mouth 3 (three) times daily as needed for muscle spasms., Disp: , Rfl:  .  methocarbamol (ROBAXIN) 500 MG tablet, Take 500 mg by mouth 4 (four) times daily., Disp: , Rfl:  .  omeprazole (PRILOSEC) 20 MG capsule, Take 20 mg by mouth daily., Disp: , Rfl:  .  rosuvastatin (CRESTOR) 10 MG tablet, Take 1 tablet (10 mg total) by mouth daily., Disp: 90 tablet, Rfl: 3  PMHx, SurgHx, SocialHx, Medications, and Allergies were reviewed in the Visit Navigator and updated as appropriate.   Past Medical History:  Diagnosis Date  . Chicken pox   . Colon cancer ([place]) 2014-2015  . Colon cancer Stage III at splenic flexure; followed at [facility]; s/p surgery and chemo. 04/09/2012  . Elevated LFTs 09/18/2017  . Fatty liver 09/18/2017  . GERD (gastroesophageal reflux disease)   . Hyperlipidemia 10/08/2011  . Insulin resistance 09/18/2017  . Obesity (BMI 30-39.9) 09/18/2017  . Positive TB test   . Seasonal allergies 10/08/2011  . Vasomotor symptoms due to menopause 09/18/2017     Past Surgical History:  Procedure Laterality Date  .  COLON SURGERY    . HEMICOLECTOMY  2013  . MICRODISCECTOMY LUMBAR  2019     Family History  Problem Relation Age of Onset  . Heart disease Mother   . Hypertension Father   . Cancer Maternal Grandmother   . Heart disease Maternal Grandfather   . Cancer Paternal Grandmother     Social History   Tobacco Use  . Smoking status: Former Research scientist (life sciences)  . Smokeless tobacco: Never Used  Substance Use Topics  . Alcohol use: Yes    Alcohol/week: 0.0 standard drinks    Comment: social  . Drug use: Never   Review of Systems:   Pertinent items are noted in the HPI. Otherwise, ROS is negative.  Objective:   BP 120/76   Pulse (!) 104   Temp 98.7 F (37.1 C) (Oral)   Ht 5\' 3"  (1.6 m)   Wt 189 lb (85.7 kg)   SpO2 97%   BMI 33.48 kg/m   General appearance: alert, cooperative and appears stated age. Head: normocephalic, without obvious abnormality, atraumatic. Neck: no adenopathy, supple, symmetrical, trachea midline; thyroid not enlarged, symmetric, no tenderness/mass/nodules. Lungs: clear to auscultation bilaterally. Heart: regular rate and rhythm Abdomen: soft, non-tender; no masses,  no organomegaly. Extremities: extremities normal, atraumatic, no cyanosis or edema. Skin: skin color, texture, turgor normal, no rashes or lesions. Lymph: cervical, supraclavicular, and axillary nodes normal; no abnormal inguinal nodes palpated. Neurologic: grossly normal.  Assessment/Plan:   Nicole Rosario was seen today for annual exam.  Diagnoses and all orders for this visit:  Insulin resistance -     Hemoglobin A1c  Gastroesophageal reflux disease without esophagitis -     CBC with Differential/Platelet  Pure hypercholesterolemia -     Comprehensive metabolic panel -     Lipid panel  Elevated LFTs  Fatty liver  H/O colon cancer, stage III -     CBC with Differential/Platelet -     CEA  Obesity (BMI 30-39.9) -     TSH   Patient Counseling: [x]    Nutrition: Stressed importance of  moderation in sodium/caffeine intake, saturated fat and cholesterol, caloric balance, sufficient intake of fresh fruits, vegetables, fiber, calcium, iron, and 1 mg of folate supplement per day (for females capable of pregnancy).  [x]    Stressed the importance of regular exercise.   [x]    Substance Abuse: Discussed cessation/primary prevention of tobacco, alcohol, or other drug use; driving or other dangerous activities under the influence; availability of treatment for abuse.   [x]    Injury prevention: Discussed safety belts, safety helmets, smoke detector, smoking near bedding or upholstery.   [x]    Sexuality: Discussed sexually transmitted diseases, partner selection, use of condoms, avoidance of unintended pregnancy  and contraceptive alternatives.  [x]    Dental health: Discussed importance of regular tooth brushing, flossing, and dental visits.  [x]    Health maintenance and immunizations reviewed. Please refer to Health maintenance section.   Briscoe Deutscher, DO [place]

## 2018-10-23 LAB — HEMOGLOBIN A1C: Hgb A1c MFr Bld: 5.8 % (ref 4.6–6.5)

## 2018-10-24 LAB — CEA: CEA: 0.7 ng/mL

## 2018-10-27 ENCOUNTER — Encounter: Payer: Self-pay | Admitting: Family Medicine

## 2018-11-27 DIAGNOSIS — Z6833 Body mass index (BMI) 33.0-33.9, adult: Secondary | ICD-10-CM | POA: Diagnosis not present

## 2018-11-27 DIAGNOSIS — Z01419 Encounter for gynecological examination (general) (routine) without abnormal findings: Secondary | ICD-10-CM | POA: Diagnosis not present

## 2018-11-27 DIAGNOSIS — Z124 Encounter for screening for malignant neoplasm of cervix: Secondary | ICD-10-CM | POA: Diagnosis not present

## 2018-12-04 ENCOUNTER — Encounter: Payer: Self-pay | Admitting: Family Medicine

## 2018-12-05 ENCOUNTER — Other Ambulatory Visit: Payer: Self-pay

## 2018-12-05 MED ORDER — CYCLOBENZAPRINE HCL 10 MG PO TABS: 10.0000 mg | ORAL_TABLET | Freq: Three times a day (TID) | ORAL | 0 refills | Status: DC | PRN

## 2018-12-05 MED FILL — CYCLOBENZAPRINE HCL 10 MG T: 10 | 10 days supply | Qty: 30 | Fill #0

## 2018-12-05 NOTE — Telephone Encounter (Signed)
I spoke with patient and informed her that Dr. Juleen China has sent in her medication and that she was transitaing to a different practice.  Patient wishes her the best.  Patient will like TOC to Dr. Eliberto Ivory and would like to keep her appointment scheduled in January.

## 2018-12-05 NOTE — Progress Notes (Signed)
flexeril

## 2018-12-08 MED FILL — ROSUVASTATIN CALCIUM 10 MG: 10 | 90 days supply | Qty: 90 | Fill #1

## 2019-04-01 DIAGNOSIS — H5213 Myopia, bilateral: Secondary | ICD-10-CM | POA: Diagnosis not present

## 2019-04-01 DIAGNOSIS — H52223 Regular astigmatism, bilateral: Secondary | ICD-10-CM | POA: Diagnosis not present

## 2019-04-01 DIAGNOSIS — Z135 Encounter for screening for eye and ear disorders: Secondary | ICD-10-CM | POA: Diagnosis not present

## 2019-04-01 DIAGNOSIS — H524 Presbyopia: Secondary | ICD-10-CM | POA: Diagnosis not present

## 2019-04-20 DIAGNOSIS — Z0100 Encounter for examination of eyes and vision without abnormal findings: Secondary | ICD-10-CM | POA: Diagnosis not present

## 2019-04-21 ENCOUNTER — Other Ambulatory Visit: Payer: Self-pay

## 2019-04-22 ENCOUNTER — Ambulatory Visit: Payer: 59 | Admitting: Family Medicine

## 2019-04-22 ENCOUNTER — Ambulatory Visit (INDEPENDENT_AMBULATORY_CARE_PROVIDER_SITE_OTHER): Payer: 59 | Admitting: Family Medicine

## 2019-04-22 ENCOUNTER — Encounter: Payer: Self-pay | Admitting: Family Medicine

## 2019-04-22 VITALS — BP 132/76 | HR 76 | Temp 98.2°F | Ht 63.0 in | Wt 189.2 lb

## 2019-04-22 DIAGNOSIS — K8020 Calculus of gallbladder without cholecystitis without obstruction: Secondary | ICD-10-CM

## 2019-04-22 DIAGNOSIS — Z9049 Acquired absence of other specified parts of digestive tract: Secondary | ICD-10-CM

## 2019-04-22 DIAGNOSIS — M545 Low back pain, unspecified: Secondary | ICD-10-CM

## 2019-04-22 DIAGNOSIS — E538 Deficiency of other specified B group vitamins: Secondary | ICD-10-CM

## 2019-04-22 DIAGNOSIS — Z9889 Other specified postprocedural states: Secondary | ICD-10-CM | POA: Diagnosis not present

## 2019-04-22 DIAGNOSIS — K760 Fatty (change of) liver, not elsewhere classified: Secondary | ICD-10-CM

## 2019-04-22 DIAGNOSIS — K219 Gastro-esophageal reflux disease without esophagitis: Secondary | ICD-10-CM

## 2019-04-22 DIAGNOSIS — E669 Obesity, unspecified: Secondary | ICD-10-CM | POA: Diagnosis not present

## 2019-04-22 DIAGNOSIS — Z85038 Personal history of other malignant neoplasm of large intestine: Secondary | ICD-10-CM | POA: Diagnosis not present

## 2019-04-22 DIAGNOSIS — E782 Mixed hyperlipidemia: Secondary | ICD-10-CM | POA: Diagnosis not present

## 2019-04-22 DIAGNOSIS — Z8249 Family history of ischemic heart disease and other diseases of the circulatory system: Secondary | ICD-10-CM

## 2019-04-22 DIAGNOSIS — M79605 Pain in left leg: Secondary | ICD-10-CM

## 2019-04-22 HISTORY — DX: Calculus of gallbladder without cholecystitis without obstruction: K80.20

## 2019-04-22 NOTE — Progress Notes (Signed)
Subjective  CC:  Chief Complaint  Patient presents with  . Transitions Of Care    transitoning care form Dr. Juleen China    HPI: Nicole Rosario is a 54 y.o. female who presents to [place] at [place] today to establish care with me as a new patient.  I used 15 minutes prior to seeing patient to review her chart in detail including prior PCP visits, specialists notes, lab work results over several years and imaging studies. I updated her problem list. 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 Happy and overall healthy.   Obesity has led to most of her current mild or borderline conditions: her diet is fair; but eats ice cream and drinks sodas regularly. Exercise is limited. She'd like to change these things. Current barrier is having to travel back to [place];  Her father was just placed on hospice for metastatic bladder cancer. This has been a sudden decline for him and is hard on the family. She is a good support for them. Eating out often.   H/o prediabetes, hypertriglyceridemia, hepatic steatosis, borderline hyperlipidemia on statin x 1 year; she has strong FH of prem CAD.   H/o fractured lumbar disc w/ radiculopathy s/p surgery; still needs mm relaxers periodically for pain but overall doing very well.   H/o colon cancer in her 62s s/o hemicolectomy and chemo. Has done well. Colonoscopy last year was clear.   Asymptomatic gallstones found on ultrasound.  No h/o mood problems.   Assessment  1. Moderate mixed hyperlipidemia not requiring statin therapy   2. H/O colon cancer, stage III   3. Obesity (BMI 30-39.9)   4. Hepatic steatosis mild by ultrasound   5. Lumbar pain with radiation down left leg   6. Hx of microdiscectomy, L5-S1   7. Hx of left hemicolectomy   8. Calculus of gallbladder without cholecystitis without obstruction     9. Gastroesophageal reflux disease without esophagitis   10. Vitamin B12 deficiency   11. Family history of premature CAD      Plan   HLD: borderline and reports more was for trigs. rec low fat diet, less red meat, stop sodas and decrease ice cream and increase activity. Discussed possible diet alternatives/changes. Will recheck at cpe off statins. Then will make recs on tx if needed.   H/o IFG: diet changes as noted above recommended. All reversible if she can do it.  Fatty liver; same recs.  GERD is controlled.   Vit b12 on oral supplementation. Will monitor; nl at last check  H/o colon cancer on asa,vit D, vit b12 per oncologist.   HM up to date.   Follow up:  Return in about 7 months (around 11/20/2019) for complete physical. No orders of the defined types were placed in this encounter.  No orders of the defined types were placed in this encounter.    Depression screen [facility] 2/9 04/22/2019 09/18/2017  Decreased Interest 0 0  Down, Depressed, Hopeless 0 0  PHQ - 2 Score 0 0    We updated and reviewed the patient's past history in detail and it is documented below.  Patient Active Problem List   Diagnosis Date Noted  . Family history of premature CAD 04/22/2019    Priority: High  . Obesity (BMI 30-39.9) 09/18/2017    Priority: High  . H/O colon cancer, stage  III 09/05/2014    Priority: High  . Hepatic steatosis mild by ultrasound 09/18/2017    Priority: Medium  . Hx of left hemicolectomy 09/18/2017    Priority: Medium  . Hx of microdiscectomy, L5-S1 09/18/2017    Priority: Medium  . Lumbar pain with radiation down left leg 09/18/2017    Priority: Medium  . GERD (gastroesophageal reflux disease) 07/03/2012    Priority: Medium  . Moderate mixed hyperlipidemia not requiring statin therapy 10/08/2011    Priority: Medium  . Cholelithiasis 04/22/2019    Priority: Low    Asymptomatic. Incidental finding on ultrasound   . Vitamin B12 deficiency 09/18/2017     Priority: Low  . Seasonal allergies 10/08/2011    Priority: Low   Health Maintenance  Topic Date Due  . MAMMOGRAM  09/18/2019  . COLONOSCOPY  10/19/2021  . PAP SMEAR-Modifier  11/27/2023  . TETANUS/TDAP  12/05/2027  . INFLUENZA VACCINE  Completed  . HIV Screening  Completed   Immunization History  Administered Date(s) Administered  . Hepatitis B 04/09/1993  . Hepatitis B, adult 04/09/1993  . Influenza-Unspecified 01/08/2015, 01/23/2018, 02/01/2019  . MMR 04/09/1988  . Td 12/04/2017  . Tdap 04/10/2003   Current Meds  Medication Sig  . aspirin EC 81 MG tablet Take 1 tablet by mouth daily.  . Cholecalciferol (VITAMIN D3) 50000 units TABS Take 1 tablet by mouth daily.  . cyclobenzaprine (FLEXERIL) 10 MG tablet Take 1 tablet (10 mg total) by mouth 3 (three) times daily as needed for muscle spasms.  . methocarbamol (ROBAXIN) 500 MG tablet Take 500 mg by mouth 4 (four) times daily.  Marland Kitchen omeprazole (PRILOSEC) 20 MG capsule Take 20 mg by mouth daily.  . [DISCONTINUED] rosuvastatin (CRESTOR) 10 MG tablet Take 1 tablet (10 mg total) by mouth daily.    Allergies: Patient is allergic to erythromycin. Past Medical History Patient  has a past medical history of Cholelithiasis (04/22/2019), Colon cancer Stage III at splenic flexure; followed at [facility]; s/p surgery and chemo. (04/09/2012), Fatty liver (09/18/2017), GERD (gastroesophageal reflux disease), Hyperlipidemia (10/08/2011), Obesity (BMI 30-39.9) (09/18/2017), Positive TB test, Seasonal allergies (10/08/2011), and Vasomotor symptoms due to menopause (09/18/2017). Past Surgical History Patient  has a past surgical history that includes Colon surgery; Microdiscectomy lumbar (2019); and Hemicolectomy (2013). Family History: Patient family history includes Cancer in her paternal grandmother; Colon cancer in her maternal grandmother; Heart disease in her maternal aunt, maternal grandfather, and mother; Hypertension in her father; Lupus in her sister;  Other in her mother; Parkinson's disease in her mother. Social History:  Patient  reports that she has quit smoking. She has never used smokeless tobacco. She reports current alcohol use. She reports that she does not use drugs.  Review of Systems: Constitutional: negative for fever or malaise Ophthalmic: negative for photophobia, double vision or loss of vision Cardiovascular: negative for chest pain, dyspnea on exertion, or new LE swelling Respiratory: negative for SOB or persistent cough Gastrointestinal: negative for abdominal pain, change in bowel habits or melena Genitourinary: negative for dysuria or gross hematuria Musculoskeletal: negative for new gait disturbance or muscular weakness Integumentary: negative for new or persistent rashes Neurological: negative for TIA or stroke symptoms Psychiatric: negative for SI or delusions Allergic/Immunologic: negative for hives  Patient Care Team    Relationship Specialty Notifications Start End  Leamon Arnt, MD PCP - General Family Medicine  04/22/19   Erline Levine, MD Consulting Physician Neurosurgery  03/31/18   Crawford Givens, MD Consulting Physician Obstetrics and Gynecology  03/31/18   Helayne Seminole, MD Consulting Physician Otolaryngology  05/16/18     Objective  Vitals: BP 132/76 (BP Location: Right Arm, Patient Position: Sitting, Cuff Size: Normal)   Pulse 76   Temp 98.2 F (36.8 C) (Temporal)   Ht 5' 3"  (1.6 m)   Wt 189 lb 3.2 oz (85.8 kg)   SpO2 98%   BMI 33.52 kg/m  General:  Well developed, well nourished, no acute distress  Psych:  Alert and oriented,normal mood and affect HEENT:  Normocephalic, atraumatic, non-icteric sclera, PERRL,  Cardiovascular:  RRR without gallop, rub or murmur Respiratory:  Good breath sounds bilaterally, CTAB with normal respiratory effort Skin:  Warm, no rashes or suspicious lesions noted Neurologic:    Mental status is normal. Gross motor and sensory exams are normal. Normal  gait   Commons side effects, risks, benefits, and alternatives for medications and treatment plan prescribed today were discussed, and the patient expressed understanding of the given instructions. Patient is instructed to call or message via MyChart if he/she has any questions or concerns regarding our treatment plan. No barriers to understanding were identified. We discussed Red Flag symptoms and signs in detail. Patient expressed understanding regarding what to do in case of urgent or emergency type symptoms.   Medication list was reconciled, printed and provided to the patient in AVS. Patient instructions and summary information was reviewed with the patient as documented in the AVS. This note was prepared with assistance of Dragon voice recognition software. Occasional wrong-word or sound-a-like substitutions may have occurred due to the inherent limitations of voice recognition software  This visit occurred during the SARS-CoV-2 public health emergency.  Safety protocols were in place, including screening questions prior to the visit, additional usage of staff PPE, and extensive cleaning of exam room while observing appropriate contact time as indicated for disinfecting solutions.

## 2019-04-22 NOTE — Patient Instructions (Signed)
Please return in August for your annual complete physical; please come fasting.  Stop both the cholesterol medication and drinking sodas!  Just let me know when you need a refill via mychart refill request.   Best of luck to you and your family. May your father be comfortable.   It was a pleasure meeting you today! Thank you for choosing [place] to meet your healthcare needs! I truly look forward to working with you. If you have any questions or concerns, please send me a message via Mychart or call the office at [phone].   Fat and Cholesterol Restricted Eating Plan Getting too much fat and cholesterol in your diet may cause health problems. Choosing the right foods helps keep your fat and cholesterol at normal levels. This can keep you from getting certain diseases. Your doctor may recommend an eating plan that includes:  Total fat: ______% or less of total calories a day.  Saturated fat: ______% or less of total calories a day.  Cholesterol: less than _________mg a day.  Fiber: ______g a day. What are tips for following this plan? Meal planning  At meals, divide your plate into four equal parts: ? Fill one-half of your plate with vegetables and green salads. ? Fill one-fourth of your plate with whole grains. ? Fill one-fourth of your plate with low-fat (lean) protein foods.  Eat fish that is high in omega-3 fats at least two times a week. This includes mackerel, tuna, sardines, and salmon.  Eat foods that are high in fiber, such as whole grains, beans, apples, broccoli, carrots, peas, and barley. General tips   Work with your doctor to lose weight if you need to.  Avoid: ? Foods with added sugar. ? Fried foods. ? Foods with partially hydrogenated oils.  Limit alcohol intake to no more than 1 drink a day for nonpregnant women and 2 drinks a day for men. One drink equals 12 oz of beer, 5 oz of wine, or 1 oz of hard liquor. Reading food labels  Check food labels  for: ? Trans fats. ? Partially hydrogenated oils. ? Saturated fat (g) in each serving. ? Cholesterol (mg) in each serving. ? Fiber (g) in each serving.  Choose foods with healthy fats, such as: ? Monounsaturated fats. ? Polyunsaturated fats. ? Omega-3 fats.  Choose grain products that have whole grains. Look for the word "whole" as the first word in the ingredient list. Cooking  Cook foods using low-fat methods. These include baking, boiling, grilling, and broiling.  Eat more home-cooked foods. Eat at restaurants and buffets less often.  Avoid cooking using saturated fats, such as butter, cream, palm oil, palm kernel oil, and coconut oil. Recommended foods  Fruits  All fresh, canned (in natural juice), or frozen fruits. Vegetables  Fresh or frozen vegetables (raw, steamed, roasted, or grilled). Green salads. Grains  Whole grains, such as whole wheat or whole grain breads, crackers, cereals, and pasta. Unsweetened oatmeal, bulgur, barley, quinoa, or brown rice. Corn or whole wheat flour tortillas. Meats and other protein foods  Ground beef (85% or leaner), grass-fed beef, or beef trimmed of fat. Skinless chicken or [place]. Ground chicken or [place]. Pork trimmed of fat. All fish and seafood. Egg whites. Dried beans, peas, or lentils. Unsalted nuts or seeds. Unsalted canned beans. Nut butters without added sugar or oil. Dairy  Low-fat or nonfat dairy products, such as skim or 1% milk, 2% or reduced-fat cheeses, low-fat and fat-free ricotta or cottage cheese, or plain low-fat and  nonfat yogurt. Fats and oils  Tub margarine without trans fats. Light or reduced-fat mayonnaise and salad dressings. Avocado. Olive, canola, sesame, or safflower oils. The items listed above may not be a complete list of foods and beverages you can eat. Contact a dietitian for more information. Foods to avoid Fruits  Canned fruit in heavy syrup. Fruit in cream or butter sauce. Fried  fruit. Vegetables  Vegetables cooked in cheese, cream, or butter sauce. Fried vegetables. Grains  White bread. White pasta. White rice. Cornbread. Bagels, pastries, and croissants. Crackers and snack foods that contain trans fat and hydrogenated oils. Meats and other protein foods  Fatty cuts of meat. Ribs, chicken wings, bacon, sausage, bologna, salami, chitterlings, fatback, hot dogs, bratwurst, and packaged lunch meats. Liver and organ meats. Whole eggs and egg yolks. Chicken and [place] with skin. Fried meat. Dairy  Whole or 2% milk, cream, half-and-half, and cream cheese. Whole milk cheeses. Whole-fat or sweetened yogurt. Full-fat cheeses. Nondairy creamers and whipped toppings. Processed cheese, cheese spreads, and cheese curds. Beverages  Alcohol. Sugar-sweetened drinks such as sodas, lemonade, and fruit drinks. Fats and oils  Butter, stick margarine, lard, shortening, ghee, or bacon fat. Coconut, palm kernel, and palm oils. Sweets and desserts  Corn syrup, sugars, honey, and molasses. Candy. Jam and jelly. Syrup. Sweetened cereals. Cookies, pies, cakes, donuts, muffins, and ice cream. The items listed above may not be a complete list of foods and beverages you should avoid. Contact a dietitian for more information. Summary  Choosing the right foods helps keep your fat and cholesterol at normal levels. This can keep you from getting certain diseases.  At meals, fill one-half of your plate with vegetables and green salads.  Eat high-fiber foods, like whole grains, beans, apples, carrots, peas, and barley.  Limit added sugar, saturated fats, alcohol, and fried foods. This information is not intended to replace advice given to you by your health care provider. Make sure you discuss any questions you have with your health care provider. Document Revised: 11/27/2017 Document Reviewed: 12/11/2016 Elsevier Patient Education  [place].

## 2019-06-12 DIAGNOSIS — C189 Malignant neoplasm of colon, unspecified: Secondary | ICD-10-CM | POA: Diagnosis not present

## 2019-06-12 LAB — CBC AND DIFFERENTIAL
HCT: 43 (ref 36–46)
Hemoglobin: 14.2 (ref 12.0–16.0)
Platelets: 245 (ref 150–399)
WBC: 5

## 2019-06-12 LAB — HEPATIC FUNCTION PANEL
ALT: 41 — AB (ref 7–35)
AST: 28 (ref 13–35)
Alkaline Phosphatase: 72 (ref 25–125)
Bilirubin, Total: 0.4

## 2019-06-12 LAB — BASIC METABOLIC PANEL
BUN: 13 (ref 4–21)
Creatinine: 0.6 (ref 0.5–1.1)
Glucose: 102
Potassium: 4.1 (ref 3.4–5.3)
Sodium: 141 (ref 137–147)

## 2019-06-12 LAB — CBC: RBC: 4.77 (ref 3.87–5.11)

## 2019-06-12 LAB — COMPREHENSIVE METABOLIC PANEL
Albumin: 4.3 (ref 3.5–5.0)
Calcium: 9.5 (ref 8.7–10.7)

## 2019-06-25 ENCOUNTER — Encounter: Payer: Self-pay | Admitting: Family Medicine

## 2019-07-06 DIAGNOSIS — L603 Nail dystrophy: Secondary | ICD-10-CM | POA: Diagnosis not present

## 2019-07-06 DIAGNOSIS — D2261 Melanocytic nevi of right upper limb, including shoulder: Secondary | ICD-10-CM | POA: Diagnosis not present

## 2019-07-06 DIAGNOSIS — D1801 Hemangioma of skin and subcutaneous tissue: Secondary | ICD-10-CM | POA: Diagnosis not present

## 2019-07-06 DIAGNOSIS — L812 Freckles: Secondary | ICD-10-CM | POA: Diagnosis not present

## 2019-07-06 DIAGNOSIS — D485 Neoplasm of uncertain behavior of skin: Secondary | ICD-10-CM | POA: Diagnosis not present

## 2019-07-06 DIAGNOSIS — I788 Other diseases of capillaries: Secondary | ICD-10-CM | POA: Diagnosis not present

## 2019-07-06 DIAGNOSIS — L821 Other seborrheic keratosis: Secondary | ICD-10-CM | POA: Diagnosis not present

## 2019-07-17 DIAGNOSIS — L98429 Non-pressure chronic ulcer of back with unspecified severity: Secondary | ICD-10-CM | POA: Diagnosis not present

## 2019-07-17 DIAGNOSIS — D485 Neoplasm of uncertain behavior of skin: Secondary | ICD-10-CM | POA: Diagnosis not present

## 2019-08-01 ENCOUNTER — Other Ambulatory Visit: Payer: Self-pay

## 2019-08-01 ENCOUNTER — Ambulatory Visit (HOSPITAL_COMMUNITY)
Admission: EM | Admit: 2019-08-01 | Discharge: 2019-08-01 | Disposition: A | Discharge status: Home / Self Care | Payer: 59 | Attending: Family Medicine | Admitting: Family Medicine

## 2019-08-01 ENCOUNTER — Encounter (HOSPITAL_COMMUNITY): Payer: Self-pay | Admitting: Family Medicine

## 2019-08-01 DIAGNOSIS — Z4802 Encounter for removal of sutures: Secondary | ICD-10-CM | POA: Diagnosis not present

## 2019-08-01 DIAGNOSIS — L089 Local infection of the skin and subcutaneous tissue, unspecified: Secondary | ICD-10-CM | POA: Diagnosis not present

## 2019-08-01 MED ORDER — MUPIROCIN 2 % EX OINT: 1.0000 "application " | TOPICAL_OINTMENT | Freq: Three times a day (TID) | CUTANEOUS | 1 refills | Status: DC

## 2019-08-01 MED FILL — MUPIROCIN 2% OINTMENT: 2 | 14 days supply | Qty: 22 | Fill #0

## 2019-08-01 NOTE — ED Provider Notes (Signed)
[facility]    CSN: [id number] Arrival date & time: 08/01/19  1006      History   Chief Complaint Chief Complaint  Patient presents with  . issue with sutures    HPI Nicole Rosario is a 54 y.o. female.   Established [facility] patient  54 yo woman with h/o metastatic colon cancer presents with "infected sutures."  Pt states she noticed the wound drainage on right shoulder 2 days ago and the redness of the wound x 4 days. The wound has a scant amount of serosanguous drainage and erythematous.   Patient had a punch biopsy there 15 days ago and for the last 5 days the area has been moist and irritated.  Had dT a couple years ago     Recent labs (CBC, metabolic profile) done on 3/5 were normal.   Note from 06/12/2019: Nicole Rosario is a very pleasant 54 y.o. DOB March 05, 1966 with T3N2a colon adenocarcinoma 07/04/11, who presents today 06/12/2019 for follow-up now 7+ years out from completion of adjuvant Folfox.  ONCOLOGY HISTORY - Summary: Stage IIIB (T3, N2a, M0) 1. Late 02/2011, the patient noted blood in the stool. This was after a viral illness, and so the patient thought it was due to "inflammation".  2. 06/25/11, colonoscopy reveals near circumferential friable mass at 40 cm. Pathology revealed high grade dysplasia.  3. 06/2011, CEA 1.4.  4. 07/2011, CT of the abdomen and pelvis reveals no metastatic disease; thickening of the splenic flexure.  5. 07/04/11, taken to the operating room for laparoscopic colon resection with colonic anastomosis. Pathology revealed low grade adenocarcinoma; 4.2 x 3.5 x 0.9 cm; no perforation; margins negative; suspicious lymphovascular invasion; no perineural invasion; 4/64 nodes positive - largest 1.1 cm; positive extranodal extension. T3N2. Microsatellite stable.  6. 07/14/11 - 07/24/11, readmitted with nausea, vomiting and abdominal pain which was felt to be due to swelling of anastomosis on TPN.  7. 07/17/11, TPN started.  8. 07/24/11,  discharged from the hospital.  9. 08/22/11, patient underwent colonoscopy with dilatation and cold biopsy of anastomotic stricture. Pathology revealing colonic mucosa with nonspecific findings. Mild chronic inflammation.  10. 08/27/11, dose one adjuvant FOLFOX therapy with oxaliplatin 85 mg/m2, bolus 5FU/leucovorin 400 mg/m2 and continuous infusion 5FU 2400 mg/m2 over 24 hours.  11. 09/13/11, CT of chest - 3 mm possible pulmonary lymph node, attention on follow up. No clear evidence of metastatic disease.  12. 09/13/11, chemotherapy continued with bolus 5FU/leucovorin on hold.  13. 09/27/11, chemotherapy with oxaliplatin on hold. We will continue with continuous infusion 5FU, bolus 5FU also on hold. This is related to her esophageal spasms. We will reevaluate adding these back at her next visit.  14. 10/12/11, oxaliplatin added at 40% dose reduction - to assess tolerance 15. 10/25/11 oxali increased to 20% dose reduction and dexamethasone taper added 16. 02/15/12 CT CAP with no evidence of metastatic or recurrent disease CEA 3.1, but likely not helpful Adjuvant therapy completed.  17. 04/17/12 CEA 1.3 17. 05/13/12 CT CAP with stable findings 18. 05/20/12 CEA 1.3. Treatment 1 on adjuvant vaccine study [id number] 19. 08/18/12 CT CAP with stable rt upper lobe opacity, small rt inguinal node, no mass seen at suture line. CEA 1.3 20. 10/02/12, EGD normal 21. 10/02/12, colonoscopy with patent end to end colo-colonic anastomosis with mild stricturing and nodularity - biopsied with path revealing acute inflammation 22. 11/17/12, CT CAP with no evidence of metastatic disease 23. 02/20/13 CT CAP With indeterminate pulmonary nodules stable and no  evidence of recurrent or metastatic disease. CEA 1.2 24. 06/19/13 CT CAP with stable findings CEA 1.1 25. 09/28/13, colonoscopy with one polyp and one area of inflammation near anastomosis. Polyp was consistent with sessile serrated adenoma while biopsy near anastomosis  revealing inflammation and no malignancy Recommend repeat in 3 yrs 25. 10/16/13, CT CAP with no evidence of recurrent or metastatic disease; CEA 1.3 26. 04/23/14 CT CAP with stable pulmonary nodules up to 4 mm. Stable postsurgical changes without evidence of metastatic disease in abdomen or pelvis. CEA 1.1 27. 10/22/14 CT CAP Stable postsurgical changes without evidence of metastatic disease in chest, abdomen, pelvis.CEA 1.4 28. 04/22/2015 CT CAP no evidence of metastatic disease in the chest abdomen or pelvis. Stable pulmonary nodules CEA 1.1 29. 08/04/15 colonoscopy with no specific abnormalities; next due 2020 30. 10/27/15, CT CAP with no evidence of recurrent or metastatic disease CEA 1.3 31. 04/30/16 CT CAP no evidence of recurrent or metastatic disease CEA 1.3 32. 10/29/16, CT CAP with no evidence of recurrent or metastatic disease 33. 10/28/17, CT CAP with no evidence of recurrent or metastatic disease 34. 10/20/18 colonoscopy without abnormalities      Past Medical History:  Diagnosis Date  . Cholelithiasis 04/22/2019  . Colon cancer Stage III at splenic flexure; followed at [facility]; s/p surgery and chemo. 04/09/2012  . Fatty liver 09/18/2017  . GERD (gastroesophageal reflux disease)   . Hyperlipidemia 10/08/2011  . Obesity (BMI 30-39.9) 09/18/2017  . Positive TB test   . Seasonal allergies 10/08/2011  . Vasomotor symptoms due to menopause 09/18/2017    Patient Active Problem List   Diagnosis Date Noted  . Cholelithiasis 04/22/2019  . Family history of premature CAD 04/22/2019  . Obesity (BMI 30-39.9) 09/18/2017  . Hepatic steatosis mild by ultrasound 09/18/2017  . Hx of left hemicolectomy 09/18/2017  . Hx of microdiscectomy, L5-S1 09/18/2017  . Lumbar pain with radiation down left leg 09/18/2017  . Vitamin B12 deficiency 09/18/2017  . H/O colon cancer, stage III 09/05/2014  . GERD (gastroesophageal reflux disease) 07/03/2012  . Moderate mixed hyperlipidemia not requiring statin  therapy 10/08/2011  . Seasonal allergies 10/08/2011    Past Surgical History:  Procedure Laterality Date  . COLON SURGERY    . HEMICOLECTOMY  2013  . LUMBAR DISC SURGERY    . MICRODISCECTOMY LUMBAR  2019    OB History   No obstetric history on file.      Home Medications    Prior to Admission medications   Medication Sig Start Date End Date Taking? Authorizing Provider  aspirin EC 81 MG tablet Take 1 tablet by mouth daily.    [provider]  Cetirizine HCl 10 MG CAPS Take 10 mg by mouth daily.    [provider]  Cholecalciferol (VITAMIN D3) 50000 units TABS Take 1 tablet by mouth daily.    [provider]  Cyanocobalamin (VITAMIN B12) 1000 MCG TBCR Take 1,000 mcg by mouth as needed.    [provider]  cyclobenzaprine (FLEXERIL) 10 MG tablet Take 1 tablet (10 mg total) by mouth 3 (three) times daily as needed for muscle spasms. 12/05/18   Briscoe Deutscher, DO  ferrous sulfate (SLOW RELEASE IRON) 160 (50 Fe) MG TBCR SR tablet Take 160 mg by mouth daily.    [provider]  methocarbamol (ROBAXIN) 500 MG tablet Take 500 mg by mouth 4 (four) times daily.    [provider]  mupirocin ointment (BACTROBAN) 2 % Apply 1 application topically 3 (three)  times daily. 08/01/19   Robyn Haber, MD  mupirocin ointment (BACTROBAN) 2 % Apply 1 application topically 3 (three) times daily. 08/01/19   Robyn Haber, MD  omeprazole (PRILOSEC) 20 MG capsule Take 20 mg by mouth daily.    [provider]    Family History Family History  Problem Relation Age of Onset  . Heart disease Mother   . Parkinson's disease Mother   . Other Mother        primary biliary sclerosis  . Hypertension Father   . Lupus Sister   . Colon cancer Maternal Grandmother   . Heart disease Maternal Grandfather   . Cancer Paternal Grandmother   . Heart disease Maternal Aunt     Social History Social History   Tobacco Use  . Smoking status: Former  Research scientist (life sciences)  . Smokeless tobacco: Never Used  Substance Use Topics  . Alcohol use: Yes    Alcohol/week: 0.0 standard drinks    Comment: social  . Drug use: Never     Allergies   Erythromycin   Review of Systems Review of Systems  Skin: Positive for rash.  All other systems reviewed and are negative.    Physical Exam Triage Vital Signs ED Triage Vitals  Enc Vitals Group     BP      Pulse      Resp      Temp      Temp src      SpO2      Weight      Height      Head Circumference      Peak Flow      Pain Score      Pain Loc      Pain Edu?      Excl. in [place]?    No data found.  Updated Vital Signs BP (!) 155/101   Pulse 76   Temp 97.9 F (36.6 C) (Oral)   Resp 16   Ht 5' 3"  (1.6 m)   Wt 84.4 kg   SpO2 97%   BMI 32.95 kg/m    Physical Exam Vitals and nursing note reviewed.  Constitutional:      Appearance: Normal appearance.  Eyes:     Conjunctiva/sclera: Conjunctivae normal.  Pulmonary:     Effort: Pulmonary effort is normal.  Musculoskeletal:        General: Normal range of motion.     Cervical back: Normal range of motion and neck supple.  Skin:    General: Skin is warm.     Comments: 5 mm superficial ulcer right shoulder.  Sutures have been removed.  Neurological:     General: No focal deficit present.     Mental Status: She is alert and oriented to person, place, and time.  Psychiatric:        Mood and Affect: Mood normal.      UC Treatments / Results  Labs (all labs ordered are listed, but only abnormal results are displayed) Labs Reviewed - No data to display  EKG   Radiology No results found.  Procedures Procedures (including critical care time)  Medications Ordered in UC Medications - No data to display  Initial Impression / Assessment and Plan / UC Course  I have reviewed the triage vital signs and the nursing notes.  Pertinent labs & imaging results that were available during my care of the patient were reviewed by me  and considered in my medical decision making (see chart for details).  Final Clinical Impressions(s) / UC Diagnoses   Final diagnoses:  Visit for suture removal  Skin infection     Discharge Instructions     Gently wash the open infection with soap and water three times a day.  Avoid alcohol or peroxide as they will delay healing.    ED Prescriptions    Medication Sig Dispense Auth. Provider   mupirocin ointment (BACTROBAN) 2 % Apply 1 application topically 3 (three) times daily. 22 g Robyn Haber, MD   mupirocin ointment (BACTROBAN) 2 % Apply 1 application topically 3 (three) times daily. 22 g Robyn Haber, MD     PDMP not reviewed this encounter.   Robyn Haber, MD 08/01/19 1041

## 2019-08-01 NOTE — ED Triage Notes (Signed)
Pt states she noticed the wound drainage on right shoulder 2 days ago and the redness of the woundx4 days. The wound has a scant amount of serosanguous drainage and erythematous.

## 2019-08-01 NOTE — Discharge Instructions (Addendum)
Gently wash the open infection with soap and water three times a day.  Avoid alcohol or peroxide as they will delay healing.

## 2019-08-03 DIAGNOSIS — Z4802 Encounter for removal of sutures: Secondary | ICD-10-CM | POA: Diagnosis not present

## 2019-11-09 ENCOUNTER — Encounter: Payer: Self-pay | Admitting: Family Medicine

## 2019-11-09 ENCOUNTER — Other Ambulatory Visit: Payer: Self-pay | Admitting: Family Medicine

## 2019-11-09 MED ORDER — METHOCARBAMOL 500 MG PO TABS: 500.0000 mg | ORAL_TABLET | Freq: Four times a day (QID) | ORAL | 2 refills | Status: DC | PRN

## 2019-11-09 MED ORDER — CYCLOBENZAPRINE HCL 10 MG PO TABS: 10.0000 mg | ORAL_TABLET | Freq: Three times a day (TID) | ORAL | 5 refills | Status: DC | PRN

## 2019-11-09 MED FILL — CYCLOBENZAPRINE HCL 10 MG T: 10 | 10 days supply | Qty: 30 | Fill #0

## 2019-11-09 MED FILL — METHOCARBAMOL 500 MG TABS: 500 | 15 days supply | Qty: 60 | Fill #0

## 2019-11-23 ENCOUNTER — Ambulatory Visit (INDEPENDENT_AMBULATORY_CARE_PROVIDER_SITE_OTHER): Payer: 59 | Admitting: Family Medicine

## 2019-11-23 ENCOUNTER — Other Ambulatory Visit: Payer: Self-pay

## 2019-11-23 ENCOUNTER — Encounter: Payer: Self-pay | Admitting: Family Medicine

## 2019-11-23 VITALS — BP 146/90 | HR 84 | Temp 98.1°F | Resp 15 | Ht 63.0 in | Wt 191.6 lb

## 2019-11-23 DIAGNOSIS — Z9049 Acquired absence of other specified parts of digestive tract: Secondary | ICD-10-CM | POA: Diagnosis not present

## 2019-11-23 DIAGNOSIS — Z8249 Family history of ischemic heart disease and other diseases of the circulatory system: Secondary | ICD-10-CM

## 2019-11-23 DIAGNOSIS — Z1231 Encounter for screening mammogram for malignant neoplasm of breast: Secondary | ICD-10-CM

## 2019-11-23 DIAGNOSIS — E782 Mixed hyperlipidemia: Secondary | ICD-10-CM | POA: Diagnosis not present

## 2019-11-23 DIAGNOSIS — E669 Obesity, unspecified: Secondary | ICD-10-CM

## 2019-11-23 DIAGNOSIS — M546 Pain in thoracic spine: Secondary | ICD-10-CM | POA: Diagnosis not present

## 2019-11-23 DIAGNOSIS — E538 Deficiency of other specified B group vitamins: Secondary | ICD-10-CM | POA: Diagnosis not present

## 2019-11-23 DIAGNOSIS — Z0000 Encounter for general adult medical examination without abnormal findings: Secondary | ICD-10-CM

## 2019-11-23 DIAGNOSIS — Z85038 Personal history of other malignant neoplasm of large intestine: Secondary | ICD-10-CM

## 2019-11-23 DIAGNOSIS — K760 Fatty (change of) liver, not elsewhere classified: Secondary | ICD-10-CM

## 2019-11-23 DIAGNOSIS — K219 Gastro-esophageal reflux disease without esophagitis: Secondary | ICD-10-CM | POA: Diagnosis not present

## 2019-11-23 NOTE — Progress Notes (Signed)
Subjective  Chief Complaint  Patient presents with  . Annual Exam    fasting  . Back Pain    center of back    HPI: Nicole Rosario is a 54 y.o. female who presents to [name] today for a Female Wellness Visit. She also has the concerns and/or needs as listed above in the chief complaint. These will be addressed in addition to the Health Maintenance Visit.   Wellness Visit: annual visit with health maintenance review and exam without Pap   HM: sees GYN: due for mammo. Diet is minimally improved but reports is drinking less sodas. Weight is stable.  Chronic disease f/u and/or acute problem visit: (deemed necessary to be done in addition to the wellness visit):  Reviewed oncology notes from [facility]. H/o colon cancer: all remains stable.   Hepatic steatosis and family history of primary biliary cholangitis in mother. Her hepatologist recommends screening with mitochondrial antibodies every 2 years. She is due now. No RUQ pain.   GERD on chronic ppi and controlled with Vit B12 deficiency. Due for recheck.   Family h/o CAD and h/o borderline hyperlipidemia. No longer on statin for recheck. No cp or sob. remians overweight  Elevated BP: trending upwards over time. No dx of HTN>   C/o 2 weeks of mid thoracic back pain; noted when she turned over in the bed; has persisted although improving. No rash. Some bilateral radiation. Has nsaids and mm relaxers.   Assessment  1. Annual physical exam   2. H/O colon cancer, stage III   3. Hx of left hemicolectomy   4. Hepatic steatosis mild by ultrasound   5. Vitamin B12 deficiency   6. Encounter for screening mammogram for breast cancer   7. Gastroesophageal reflux disease without esophagitis   8. Obesity (BMI 30-39.9)   9. Family history of premature CAD   70. Mixed hyperlipidemia   11. Acute midline thoracic back pain      Plan  Female Wellness Visit:  Age appropriate Health Maintenance and Prevention measures  were discussed with patient. Included topics are cancer screening recommendations, ways to keep healthy (see AVS) including dietary and exercise recommendations, regular eye and dental care, use of seat belts, and avoidance of moderate alcohol use and tobacco use. To see GYN and get mammo at [facility].   BMI: discussed patient's BMI and encouraged positive lifestyle modifications to help get to or maintain a target BMI.  HM needs and immunizations were addressed and ordered. See below for orders. See HM and immunization section for updates. utd  Routine labs and screening tests ordered including cmp, cbc and lipids where appropriate.  Discussed recommendations regarding Vit D and calcium supplementation (see AVS)  Chronic disease management visit and/or acute problem visit:  Elevated BP: discussed readings: rec home readings x 2 weeks and then reevaluate. Low threshold for starting meds. Educated on low salt diet and weight loss  HLD: recheck fasting and assess if statin is indicated.   GERD is controlled  Recheck b12 levels.   Thoracic back pain: MSK: improving. Continue conservative care and recheck if persists > 6 weeks or worsens.   Liver f/u: check lfts and screen with mitochondrial abs. rec low fat diet.    Follow up: Return in about 6 months (around 05/25/2020) for recheck blood pressure.  Orders Placed This Encounter  Procedures  . MM DIGITAL SCREENING BILATERAL  . Hepatitis C antibody  . CBC with Differential/Platelet  . Comprehensive metabolic  panel  . Lipid panel  . TSH  . Vitamin B12  . Mitochondrial antibodies  . Hemoglobin A1c   No orders of the defined types were placed in this encounter.     Lifestyle: Body mass index is 33.94 kg/m. Wt Readings from Last 3 Encounters:  11/23/19 191 lb 9.6 oz (86.9 kg)  08/01/19 186 lb (84.4 kg)  04/22/19 189 lb 3.2 oz (85.8 kg)   BP Readings from Last 3 Encounters:  11/23/19 (!) 146/90  08/01/19 (!) 155/101  04/22/19  132/76     Patient Active Problem List   Diagnosis Date Noted  . Family history of premature CAD 04/22/2019    Priority: High  . Obesity (BMI 30-39.9) 09/18/2017    Priority: High  . H/O colon cancer, stage III 09/05/2014    Priority: High  . Hepatic steatosis mild by ultrasound 09/18/2017    Priority: Medium  . Hx of left hemicolectomy 09/18/2017    Priority: Medium  . Hx of microdiscectomy, L5-S1 09/18/2017    Priority: Medium  . Lumbar pain with radiation down left leg 09/18/2017    Priority: Medium  . GERD (gastroesophageal reflux disease) 07/03/2012    Priority: Medium  . Mixed hyperlipidemia 10/08/2011    Priority: Medium  . Cholelithiasis 04/22/2019    Priority: Low    Asymptomatic. Incidental finding on ultrasound   . Vitamin B12 deficiency 09/18/2017    Priority: Low  . Seasonal allergies 10/08/2011    Priority: Low   Health Maintenance  Topic Date Due  . Hepatitis C Screening  Never done  . COVID-19 Vaccine (1) Never done  . MAMMOGRAM  09/18/2019  . INFLUENZA VACCINE  11/08/2019  . COLONOSCOPY  10/19/2021  . PAP SMEAR-Modifier  11/27/2023  . TETANUS/TDAP  12/05/2027  . HIV Screening  Completed   Immunization History  Administered Date(s) Administered  . Hepatitis B 04/09/1993  . Hepatitis B, adult 04/09/1993  . Influenza-Unspecified 01/08/2015, 01/23/2018, 02/01/2019  . MMR 04/09/1988  . Td 12/04/2017  . Tdap 04/10/2003   We updated and reviewed the patient's past history in detail and it is documented below. Allergies: Patient is allergic to erythromycin. Past Medical History Patient  has a past medical history of Cholelithiasis (04/22/2019), Colon cancer Stage III at splenic flexure; followed at [facility]; s/p surgery and chemo. (04/09/2012), Fatty liver (09/18/2017), GERD (gastroesophageal reflux disease), Hyperlipidemia (10/08/2011), Obesity (BMI 30-39.9) (09/18/2017), Positive TB test, Seasonal allergies (10/08/2011), and Vasomotor symptoms due to menopause  (09/18/2017). Past Surgical History Patient  has a past surgical history that includes Colon surgery; Microdiscectomy lumbar (2019); Hemicolectomy (2013); and Lumbar disc surgery. Family History: Patient family history includes Cancer in her paternal grandmother; Colon cancer in her maternal grandmother; Heart disease in her maternal aunt, maternal grandfather, and mother; Hypertension in her father; Lupus in her sister; Other in her mother; Parkinson's disease in her mother. Social History:  Patient  reports that she has quit smoking. She has never used smokeless tobacco. She reports current alcohol use. She reports that she does not use drugs.  Review of Systems: Constitutional: negative for fever or malaise Ophthalmic: negative for photophobia, double vision or loss of vision Cardiovascular: negative for chest pain, dyspnea on exertion, or new LE swelling Respiratory: negative for SOB or persistent cough Gastrointestinal: negative for abdominal pain, change in bowel habits or melena Genitourinary: negative for dysuria or gross hematuria, no abnormal uterine bleeding or disharge Musculoskeletal: negative for new gait disturbance or muscular weakness Integumentary: negative for new or  persistent rashes, no breast lumps Neurological: negative for TIA or stroke symptoms Psychiatric: negative for SI or delusions Allergic/Immunologic: negative for hives  Patient Care Team    Relationship Specialty Notifications Start End  Leamon Arnt, MD PCP - General Family Medicine  04/22/19   Erline Levine, MD Consulting Physician Neurosurgery  03/31/18   Crawford Givens, MD Consulting Physician Obstetrics and Gynecology  03/31/18   Helayne Seminole, MD Consulting Physician Otolaryngology  05/16/18     Objective  Vitals: BP (!) 146/90   Pulse 84   Temp 98.1 F (36.7 C) (Temporal)   Resp 15   Ht 5' 3"  (1.6 m)   Wt 191 lb 9.6 oz (86.9 kg)   SpO2 97%   BMI 33.94 kg/m  General:  Well developed,  well nourished, no acute distress  Psych:  Alert and orientedx3,normal mood and affect HEENT:  Normocephalic, atraumatic, non-icteric sclera,  supple neck without adenopathy, mass or thyromegaly Cardiovascular:  Normal S1, S2, RRR without gallop, rub or murmur Respiratory:  Good breath sounds bilaterally, CTAB with normal respiratory effort Gastrointestinal: normal bowel sounds, soft, non-tender, no noted masses. No HSM MSK: no deformities, contusions. Joints are without erythema or swelling. Mid back with paravertebral mm ttp w/o mass or spasm Skin:  Warm, no rashes or suspicious lesions noted Neurologic:    Mental status is normal. CN 2-11 are normal. Gross motor and sensory exams are normal. Normal gait. No tremor     Commons side effects, risks, benefits, and alternatives for medications and treatment plan prescribed today were discussed, and the patient expressed understanding of the given instructions. Patient is instructed to call or message via MyChart if he/she has any questions or concerns regarding our treatment plan. No barriers to understanding were identified. We discussed Red Flag symptoms and signs in detail. Patient expressed understanding regarding what to do in case of urgent or emergency type symptoms.   Medication list was reconciled, printed and provided to the patient in AVS. Patient instructions and summary information was reviewed with the patient as documented in the AVS. This note was prepared with assistance of Dragon voice recognition software. Occasional wrong-word or sound-a-like substitutions may have occurred due to the inherent limitations of voice recognition software  This visit occurred during the SARS-CoV-2 public health emergency.  Safety protocols were in place, including screening questions prior to the visit, additional usage of staff PPE, and extensive cleaning of exam room while observing appropriate contact time as indicated for disinfecting solutions.

## 2019-11-23 NOTE — Patient Instructions (Addendum)
Please return in 6 months for blood pressure follow up. Please check your blood pressures daily for the next 2 weeks and send me the readings. We will start medications if the are typically > 135/85.   I will release your lab results to you on your MyChart account with further instructions. Please reply with any questions.    If you have any questions or concerns, please don't hesitate to send me a message via MyChart or call the office at [phone]. Thank you for visiting with [place] today! It's our pleasure caring for you.  I have ordered a mammogram and/or bone density for you as we discussed today: [x]   Mammogram  []   Bone Density  Please call the office checked below to schedule your appointment: Your appointment will at the following location  [x]   [facility]      [place], [place]         []   [place], [place]   Fat and Cholesterol Restricted Eating Plan Getting too much fat and cholesterol in your diet may cause health problems. Choosing the right foods helps keep your fat and cholesterol at normal levels. This can keep you from getting certain diseases. Your doctor may recommend an eating plan that includes:  Total fat: ______% or less of total calories a day.  Saturated fat: ______% or less of total calories a day.  Cholesterol: less than _________mg a day.  Fiber: ______g a day. What are tips for following this plan? Meal planning  At meals, divide your plate into four equal parts: ? Fill one-half of your plate with vegetables and green salads. ? Fill one-fourth of your plate with whole grains. ? Fill one-fourth of your plate with low-fat (lean) protein foods.  Eat fish that is high in omega-3 fats at least two times a week. This includes mackerel, tuna, sardines, and salmon.  Eat foods that are high in fiber, such as whole grains, beans,  apples, broccoli, carrots, peas, and barley. General tips   Work with your doctor to lose weight if you need to.  Avoid: ? Foods with added sugar. ? Fried foods. ? Foods with partially hydrogenated oils.  Limit alcohol intake to no more than 1 drink a day for nonpregnant women and 2 drinks a day for men. One drink equals 12 oz of beer, 5 oz of wine, or 1 oz of hard liquor. Reading food labels  Check food labels for: ? Trans fats. ? Partially hydrogenated oils. ? Saturated fat (g) in each serving. ? Cholesterol (mg) in each serving. ? Fiber (g) in each serving.  Choose foods with healthy fats, such as: ? Monounsaturated fats. ? Polyunsaturated fats. ? Omega-3 fats.  Choose grain products that have whole grains. Look for the word "whole" as the first word in the ingredient list. Cooking  Cook foods using low-fat methods. These include baking, boiling, grilling, and broiling.  Eat more home-cooked foods. Eat at restaurants and buffets less often.  Avoid cooking using saturated fats, such as butter, cream, palm oil, palm kernel oil, and coconut oil. Recommended foods  Fruits  All fresh, canned (in natural juice), or frozen fruits. Vegetables  Fresh or frozen vegetables (raw, steamed, roasted, or grilled). Green salads. Grains  Whole grains, such as whole wheat or whole grain breads, crackers, cereals, and  pasta. Unsweetened oatmeal, bulgur, barley, quinoa, or brown rice. Corn or whole wheat flour tortillas. Meats and other protein foods  Ground beef (85% or leaner), grass-fed beef, or beef trimmed of fat. Skinless chicken or [place]. Ground chicken or [place]. Pork trimmed of fat. All fish and seafood. Egg whites. Dried beans, peas, or lentils. Unsalted nuts or seeds. Unsalted canned beans. Nut butters without added sugar or oil. Dairy  Low-fat or nonfat dairy products, such as skim or 1% milk, 2% or reduced-fat cheeses, low-fat and fat-free ricotta or cottage cheese, or  plain low-fat and nonfat yogurt. Fats and oils  Tub margarine without trans fats. Light or reduced-fat mayonnaise and salad dressings. Avocado. Olive, canola, sesame, or safflower oils. The items listed above may not be a complete list of foods and beverages you can eat. Contact a dietitian for more information. Foods to avoid Fruits  Canned fruit in heavy syrup. Fruit in cream or butter sauce. Fried fruit. Vegetables  Vegetables cooked in cheese, cream, or butter sauce. Fried vegetables. Grains  White bread. White pasta. White rice. Cornbread. Bagels, pastries, and croissants. Crackers and snack foods that contain trans fat and hydrogenated oils. Meats and other protein foods  Fatty cuts of meat. Ribs, chicken wings, bacon, sausage, bologna, salami, chitterlings, fatback, hot dogs, bratwurst, and packaged lunch meats. Liver and organ meats. Whole eggs and egg yolks. Chicken and [place] with skin. Fried meat. Dairy  Whole or 2% milk, cream, half-and-half, and cream cheese. Whole milk cheeses. Whole-fat or sweetened yogurt. Full-fat cheeses. Nondairy creamers and whipped toppings. Processed cheese, cheese spreads, and cheese curds. Beverages  Alcohol. Sugar-sweetened drinks such as sodas, lemonade, and fruit drinks. Fats and oils  Butter, stick margarine, lard, shortening, ghee, or bacon fat. Coconut, palm kernel, and palm oils. Sweets and desserts  Corn syrup, sugars, honey, and molasses. Candy. Jam and jelly. Syrup. Sweetened cereals. Cookies, pies, cakes, donuts, muffins, and ice cream. The items listed above may not be a complete list of foods and beverages you should avoid. Contact a dietitian for more information. Summary  Choosing the right foods helps keep your fat and cholesterol at normal levels. This can keep you from getting certain diseases.  At meals, fill one-half of your plate with vegetables and green salads.  Eat high-fiber foods, like whole grains, beans, apples,  carrots, peas, and barley.  Limit added sugar, saturated fats, alcohol, and fried foods. This information is not intended to replace advice given to you by your health care provider. Make sure you discuss any questions you have with your health care provider. Document Revised: 11/27/2017 Document Reviewed: 12/11/2016 Elsevier Patient Education  [place].

## 2019-11-24 LAB — CBC WITH DIFFERENTIAL/PLATELET
Absolute Monocytes: 372 cells/uL (ref 200–950)
Basophils Absolute: 49 cells/uL (ref 0–200)
Basophils Relative: 1 %
Eosinophils Absolute: 240 cells/uL (ref 15–500)
Eosinophils Relative: 4.9 %
HCT: 43.3 % (ref 35.0–45.0)
Hemoglobin: 14.7 g/dL (ref 11.7–15.5)
Lymphs Abs: 1833 cells/uL (ref 850–3900)
MCH: 29.9 pg (ref 27.0–33.0)
MCHC: 33.9 g/dL (ref 32.0–36.0)
MCV: 88.2 fL (ref 80.0–100.0)
MPV: 10.2 fL (ref 7.5–12.5)
Monocytes Relative: 7.6 %
Neutro Abs: 2406 cells/uL (ref 1500–7800)
Neutrophils Relative %: 49.1 %
Platelets: 255 10*3/uL (ref 140–400)
RBC: 4.91 10*6/uL (ref 3.80–5.10)
RDW: 12.7 % (ref 11.0–15.0)
Total Lymphocyte: 37.4 %
WBC: 4.9 10*3/uL (ref 3.8–10.8)

## 2019-11-24 LAB — COMPREHENSIVE METABOLIC PANEL
AG Ratio: 1.9 (calc) (ref 1.0–2.5)
ALT: 33 U/L — ABNORMAL HIGH (ref 6–29)
AST: 23 U/L (ref 10–35)
Albumin: 4.5 g/dL (ref 3.6–5.1)
Alkaline phosphatase (APISO): 72 U/L (ref 37–153)
BUN: 13 mg/dL (ref 7–25)
CO2: 28 mmol/L (ref 20–32)
Calcium: 9.5 mg/dL (ref 8.6–10.4)
Chloride: 104 mmol/L (ref 98–110)
Creat: 0.7 mg/dL (ref 0.50–1.05)
Globulin: 2.4 g/dL (calc) (ref 1.9–3.7)
Glucose, Bld: 87 mg/dL (ref 65–99)
Potassium: 4.3 mmol/L (ref 3.5–5.3)
Sodium: 141 mmol/L (ref 135–146)
Total Bilirubin: 0.4 mg/dL (ref 0.2–1.2)
Total Protein: 6.9 g/dL (ref 6.1–8.1)

## 2019-11-24 LAB — TSH: TSH: 3.31 mIU/L

## 2019-11-24 LAB — LIPID PANEL
Cholesterol: 252 mg/dL — ABNORMAL HIGH (ref <200)
HDL: 50 mg/dL (ref >=50)
LDL Cholesterol (Calc): 161 mg/dL (calc) — ABNORMAL HIGH
Non-HDL Cholesterol (Calc): 202 mg/dL (calc) — ABNORMAL HIGH (ref <130)
Total CHOL/HDL Ratio: 5 (calc) — ABNORMAL HIGH (ref <5.0)
Triglycerides: 248 mg/dL — ABNORMAL HIGH (ref <150)

## 2019-11-24 LAB — HEPATITIS C ANTIBODY
Hepatitis C Ab: NONREACTIVE
SIGNAL TO CUT-OFF: 0 (ref <1.00)

## 2019-11-24 LAB — VITAMIN B12: Vitamin B-12: 1271 pg/mL — ABNORMAL HIGH (ref 200–1100)

## 2019-11-24 LAB — MITOCHONDRIAL ANTIBODIES: Mitochondrial M2 Ab, IgG: 20.8 U — ABNORMAL HIGH

## 2019-12-16 ENCOUNTER — Other Ambulatory Visit: Payer: Self-pay

## 2019-12-16 ENCOUNTER — Ambulatory Visit
Admission: RE | Admit: 2019-12-16 | Discharge: 2019-12-16 | Disposition: A | Discharge status: Home / Self Care | Payer: 59 | Source: Ambulatory Visit | Attending: Family Medicine | Admitting: Family Medicine

## 2019-12-16 DIAGNOSIS — Z1231 Encounter for screening mammogram for malignant neoplasm of breast: Secondary | ICD-10-CM | POA: Diagnosis not present

## 2019-12-16 DIAGNOSIS — Z0000 Encounter for general adult medical examination without abnormal findings: Secondary | ICD-10-CM

## 2020-01-17 ENCOUNTER — Encounter: Payer: Self-pay | Admitting: Family Medicine

## 2020-01-18 ENCOUNTER — Other Ambulatory Visit: Payer: Self-pay | Admitting: Family Medicine

## 2020-01-18 MED ORDER — LISINOPRIL 10 MG PO TABS: 10.0000 mg | ORAL_TABLET | Freq: Every day | ORAL | 3 refills | Status: DC

## 2020-01-18 MED FILL — LISINOPRIL 10 MG TABS: 10 | 90 days supply | Qty: 90 | Fill #0

## 2020-03-01 MED FILL — CYCLOBENZAPRINE HCL 10 MG T: 10 | 10 days supply | Qty: 30 | Fill #1

## 2020-03-07 DIAGNOSIS — Z01419 Encounter for gynecological examination (general) (routine) without abnormal findings: Secondary | ICD-10-CM | POA: Diagnosis not present

## 2020-03-07 DIAGNOSIS — Z1211 Encounter for screening for malignant neoplasm of colon: Secondary | ICD-10-CM | POA: Diagnosis not present

## 2020-05-25 ENCOUNTER — Ambulatory Visit: Payer: 59 | Admitting: Family Medicine

## 2020-06-10 DIAGNOSIS — M546 Pain in thoracic spine: Secondary | ICD-10-CM | POA: Diagnosis not present

## 2020-06-10 DIAGNOSIS — C189 Malignant neoplasm of colon, unspecified: Secondary | ICD-10-CM | POA: Diagnosis not present

## 2020-06-23 ENCOUNTER — Ambulatory Visit (INDEPENDENT_AMBULATORY_CARE_PROVIDER_SITE_OTHER): Payer: 59

## 2020-06-23 ENCOUNTER — Encounter: Payer: Self-pay | Admitting: Family Medicine

## 2020-06-23 ENCOUNTER — Other Ambulatory Visit: Payer: Self-pay

## 2020-06-23 ENCOUNTER — Ambulatory Visit: Payer: 59 | Admitting: Family Medicine

## 2020-06-23 ENCOUNTER — Other Ambulatory Visit: Payer: Self-pay | Admitting: Family Medicine

## 2020-06-23 VITALS — BP 140/80 | HR 79 | Temp 97.9°F | Ht 63.0 in | Wt 191.4 lb

## 2020-06-23 DIAGNOSIS — I10 Essential (primary) hypertension: Secondary | ICD-10-CM | POA: Diagnosis not present

## 2020-06-23 DIAGNOSIS — M546 Pain in thoracic spine: Secondary | ICD-10-CM

## 2020-06-23 DIAGNOSIS — Z9049 Acquired absence of other specified parts of digestive tract: Secondary | ICD-10-CM | POA: Diagnosis not present

## 2020-06-23 DIAGNOSIS — Z23 Encounter for immunization: Secondary | ICD-10-CM | POA: Diagnosis not present

## 2020-06-23 DIAGNOSIS — G8929 Other chronic pain: Secondary | ICD-10-CM

## 2020-06-23 DIAGNOSIS — Z7185 Encounter for immunization safety counseling: Secondary | ICD-10-CM

## 2020-06-23 DIAGNOSIS — Z85038 Personal history of other malignant neoplasm of large intestine: Secondary | ICD-10-CM

## 2020-06-23 DIAGNOSIS — Z9889 Other specified postprocedural states: Secondary | ICD-10-CM | POA: Diagnosis not present

## 2020-06-23 DIAGNOSIS — M5134 Other intervertebral disc degeneration, thoracic region: Secondary | ICD-10-CM | POA: Diagnosis not present

## 2020-06-23 MED ORDER — MELOXICAM 15 MG PO TABS: 15.0000 mg | ORAL_TABLET | Freq: Every day | ORAL | 0 refills | Status: DC

## 2020-06-23 NOTE — Progress Notes (Signed)
Subjective  CC:  Chief Complaint  Patient presents with  . Hypertension    HPI: Nicole Rosario is a 55 y.o. female who presents to the office today to address the problems listed above in the chief complaint.  Hypertension f/u: 55 year old who started lisinopril 10 mg daily 4 months ago.  Here for follow-up of blood pressures.  Reports she is tolerating medication well without adverse effects.  Home blood pressure readings have been improved: Now on average they avg 130/70s but will be higher on occasions.  Today she has significant back pain and I suspect her elevated blood pressure is due to pain response.  Reports she is doing well. taking medications as instructed, no medication side effects noted, no TIAs, no chest pain on exertion, no dyspnea on exertion, no swelling of ankles. She denies adverse effects from his BP medications. Compliance with medication is good.  She had normal renal function and potassium levels at her recent visit with [facility] oncology.  She is tolerating her ACE inhibitor.  Thoracic back pain: This started back in August.  She has used muscle relaxers and NSAIDs.  At first it had improved but now is more constant and chronic.  She reports midline back pain worse throughout the day with radiation to the right side.  Worse with certain movements.  No low back pain.  No neck pain.  No injuries.  History of colon cancer and left hemicolectomy: Reviewed [facility] oncology follow-up visit in March.  She had chest abdomen and pelvic CT to rule out metastatic disease given her persistent back pain complaint.  Fortunately, this was negative for metastatic disease.  Her most recent colonoscopy was normal as well.  She had lab work which I reviewed, normal CEA, CMP and CBC.  She is eligible for Shingrix vaccination.  Assessment  1. Essential hypertension   2. Vaccine counseling   3. Chronic midline thoracic back pain   4. H/O colon cancer, stage III   5. Hx of left hemicolectomy   6.  Hx of microdiscectomy, L5-S1      Plan    Hypertension f/u: BP control is fairly well controlled.  I suspect she is has a pain response with elevated reading today.  She will continue to monitor at home.  Continue lisinopril 10 mg daily.  Blood pressures are trending upwards, averaging greater than 135/85, will increase lisinopril 20 mg daily.  Patient is aware and agrees with care plan.  Normal renal function post ACE inhibitor.  Colon cancer f/u: She is now 8 years plus out from treatment.  Annual surveillance remains positive.  Chronic midline thoracic back pain that radiates to the right: Check x-rays for osteoarthritic changes.  Suspect some musculoskeletal and mechanical factors.  Recommend physical therapy and meloxicam as needed.  She has history of lumbar surgery but this currently is not active.  Vaccine counseling: Educated on indications and expectations and recommendations for Shingrix.  First dose given today.  Will repeat in 6 months.   Education regarding management of these chronic disease states was given. Management strategies discussed on successive visits include dietary and exercise recommendations, goals of achieving and maintaining IBW, and lifestyle modifications aiming for adequate sleep and minimizing stressors.   Follow up: Return in about 6 months (around 12/24/2020) for complete physical.  Orders Placed This Encounter  Procedures  . DG Thoracic Spine 4V  . Basic metabolic panel  . Ambulatory referral to Physical Therapy   Meds ordered this encounter  Medications  .  meloxicam (MOBIC) 15 MG tablet    Sig: Take 1 tablet (15 mg total) by mouth daily.    Dispense:  30 tablet    Refill:  0      BP Readings from Last 3 Encounters:  06/23/20 140/80  11/23/19 (!) 146/90  08/01/19 (!) 155/101   Wt Readings from Last 3 Encounters:  06/23/20 191 lb 6.1 oz (86.8 kg)  11/23/19 191 lb 9.6 oz (86.9 kg)  08/01/19 186 lb (84.4 kg)    Lab Results  Component  Value Date   CHOL 252 (H) 11/23/2019   CHOL 183 10/22/2018   CHOL 235 (H) 03/26/2018   Lab Results  Component Value Date   HDL 50 11/23/2019   HDL 46.40 10/22/2018   HDL 47.50 03/26/2018   Lab Results  Component Value Date   LDLCALC 161 (H) 11/23/2019   LDLCALC 111 04/12/2017   Lab Results  Component Value Date   TRIG 248 (H) 11/23/2019   TRIG 272.0 (H) 10/22/2018   TRIG 330.0 (H) 03/26/2018   Lab Results  Component Value Date   CHOLHDL 5.0 (H) 11/23/2019   CHOLHDL 4 10/22/2018   CHOLHDL 5 03/26/2018   Lab Results  Component Value Date   LDLDIRECT 107.0 10/22/2018   LDLDIRECT 132.0 03/26/2018   LDLDIRECT 135.0 09/18/2017   Lab Results  Component Value Date   CREATININE 0.70 11/23/2019   BUN 13 11/23/2019   NA 141 11/23/2019   K 4.3 11/23/2019   CL 104 11/23/2019   CO2 28 11/23/2019    The 10-year ASCVD risk score Mikey Bussing DC Jr., et al., 2013) is: 3.8%   Values used to calculate the score:     Age: 71 years     Sex: Female     Is Non-Hispanic African American: No     Diabetic: No     Tobacco smoker: No     Systolic Blood Pressure: 157 mmHg     Is BP treated: Yes     HDL Cholesterol: 50 mg/dL     Total Cholesterol: 252 mg/dL  I reviewed the patients updated PMH, FH, and SocHx.    Patient Active Problem List   Diagnosis Date Noted  . Essential hypertension 06/23/2020    Priority: High  . Family history of premature CAD 04/22/2019    Priority: High  . Obesity (BMI 30-39.9) 09/18/2017    Priority: High  . H/O colon cancer, stage III 09/05/2014    Priority: High  . Hepatic steatosis mild by ultrasound 09/18/2017    Priority: Medium  . Hx of left hemicolectomy 09/18/2017    Priority: Medium  . Hx of microdiscectomy, L5-S1 09/18/2017    Priority: Medium  . GERD (gastroesophageal reflux disease) 07/03/2012    Priority: Medium  . Mixed hyperlipidemia 10/08/2011    Priority: Medium  . Cholelithiasis 04/22/2019    Priority: Low  . Vitamin B12  deficiency 09/18/2017    Priority: Low  . Seasonal allergies 10/08/2011    Priority: Low    Allergies: Erythromycin  Social History: Patient  reports that she has quit smoking. She has never used smokeless tobacco. She reports current alcohol use. She reports that she does not use drugs.  Current Meds  Medication Sig  . aspirin EC 81 MG tablet Take 1 tablet by mouth daily.  . Cetirizine HCl 10 MG CAPS Take 10 mg by mouth daily.  . Cholecalciferol (VITAMIN D3) 50000 units TABS Take 1 tablet by mouth daily.  . Cyanocobalamin (VITAMIN B12)  1000 MCG TBCR Take 1,000 mcg by mouth as needed.  . cyclobenzaprine (FLEXERIL) 10 MG tablet Take 1 tablet (10 mg total) by mouth 3 (three) times daily as needed for muscle spasms.  . ferrous sulfate (SLOW RELEASE IRON) 160 (50 Fe) MG TBCR SR tablet Take 160 mg by mouth daily.  Marland Kitchen lisinopril (ZESTRIL) 10 MG tablet Take 1 tablet (10 mg total) by mouth daily.  . meloxicam (MOBIC) 15 MG tablet Take 1 tablet (15 mg total) by mouth daily.  . methocarbamol (ROBAXIN) 500 MG tablet Take 1 tablet (500 mg total) by mouth every 6 (six) hours as needed for muscle spasms.  Marland Kitchen omeprazole (PRILOSEC) 20 MG capsule Take 20 mg by mouth daily.    Review of Systems: Cardiovascular: negative for chest pain, palpitations, leg swelling, orthopnea Respiratory: negative for SOB, wheezing or persistent cough Gastrointestinal: negative for abdominal pain Genitourinary: negative for dysuria or gross hematuria  Objective  Vitals: BP 140/80 (BP Location: Left Arm, Patient Position: Sitting, Cuff Size: Normal)   Pulse 79   Temp 97.9 F (36.6 C) (Temporal)   Ht 5\' 3"  (1.6 m)   Wt 191 lb 6.1 oz (86.8 kg)   SpO2 97%   BMI 33.90 kg/m  General: Appears uncomfortable but nontoxic Psych:  Alert and oriented, normal mood and affect HEENT:  Normocephalic, atraumatic, supple neck  Cardiovascular:  RRR without murmur. no edema Respiratory:  Good breath sounds bilaterally, CTAB with  normal respiratory effort Back: No midline back tenderness: Thoracic paravertebral muscles are tight and tender, no rashes, full range of motion low back.  Normal gait.    Commons side effects, risks, benefits, and alternatives for medications and treatment plan prescribed today were discussed, and the patient expressed understanding of the given instructions. Patient is instructed to call or message via MyChart if he/she has any questions or concerns regarding our treatment plan. No barriers to understanding were identified. We discussed Red Flag symptoms and signs in detail. Patient expressed understanding regarding what to do in case of urgent or emergency type symptoms.   Medication list was reconciled, printed and provided to the patient in AVS. Patient instructions and summary information was reviewed with the patient as documented in the AVS. This note was prepared with assistance of Dragon voice recognition software. Occasional wrong-word or sound-a-like substitutions may have occurred due to the inherent limitations of voice recognition software  This visit occurred during the SARS-CoV-2 public health emergency.  Safety protocols were in place, including screening questions prior to the visit, additional usage of staff PPE, and extensive cleaning of exam room while observing appropriate contact time as indicated for disinfecting solutions.

## 2020-06-23 NOTE — Patient Instructions (Addendum)
Please return in 6 months for your annual complete physical; please come fasting.  Today you were given your 1st of 2 shingrix vaccination. We will give you the 2nd dose in 6 months.   We will call you to get you set up with our physical therapist. You may use meloxicam as needed for pain.   Keep an eye on your blood pressures. We want them consistently 120s/70s. We will increase lisinopril to 20mg  daily IF BP remains > 135/85. Today's elevated reading likely is due to pain response.   If you have any questions or concerns, please don't hesitate to send me a message via MyChart or call the office at [phone]. Thank you for visiting with [place] today! It's our pleasure caring for you.

## 2020-06-23 NOTE — Addendum Note (Signed)
Addended by: Marian Sorrow on: 06/23/2020 10:46 AM   Modules accepted: Orders

## 2020-06-30 ENCOUNTER — Ambulatory Visit (INDEPENDENT_AMBULATORY_CARE_PROVIDER_SITE_OTHER): Payer: 59 | Admitting: Physical Therapy

## 2020-06-30 ENCOUNTER — Other Ambulatory Visit: Payer: Self-pay

## 2020-06-30 ENCOUNTER — Encounter: Payer: Self-pay | Admitting: Physical Therapy

## 2020-06-30 DIAGNOSIS — M542 Cervicalgia: Secondary | ICD-10-CM

## 2020-06-30 DIAGNOSIS — M6281 Muscle weakness (generalized): Secondary | ICD-10-CM | POA: Diagnosis not present

## 2020-06-30 DIAGNOSIS — M546 Pain in thoracic spine: Secondary | ICD-10-CM

## 2020-06-30 NOTE — Therapy (Signed)
[facility] [address] [place], [place], [address] Phone: [phone]   Fax:  [phone]  Physical Therapy Treatment  Patient Details  Name: Nicole Rosario MRN: [id number] Date of Birth: 04/24/1965 Referring Provider (PT): Dr. Jonni Sanger   Encounter Date: 06/30/2020   PT End of Session - 06/30/20 2058    Visit Number 1    Number of Visits 13    Date for PT Re-Evaluation 08/11/20    Authorization Type [employer]    PT Start Time 1603    PT Stop Time 1648    PT Time Calculation (min) 45 min    Activity Tolerance Patient tolerated treatment well    Behavior During Therapy [facility] for tasks assessed/performed           Past Medical History:  Diagnosis Date  . Cholelithiasis 04/22/2019  . Colon cancer Stage III at splenic flexure; followed at [facility]; s/p surgery and chemo. 04/09/2012  . Fatty liver 09/18/2017  . GERD (gastroesophageal reflux disease)   . Hyperlipidemia 10/08/2011  . Obesity (BMI 30-39.9) 09/18/2017  . Positive TB test   . Seasonal allergies 10/08/2011  . Vasomotor symptoms due to menopause 09/18/2017    Past Surgical History:  Procedure Laterality Date  . COLON SURGERY    . HEMICOLECTOMY  2013  . LUMBAR DISC SURGERY    . MICRODISCECTOMY LUMBAR  2019       Subjective Assessment - 06/30/20 1609    Subjective Pt states that the midback pain became "persistent" back in Aug. She noticed while rolling over in bed one day. Most recently, she will notice pain with trunk rotation. Pt states the pain is unpredictable and can be described as "locking."  She states the pain is from the sternum level and lower. She states it sometimes comes along with some neck and shoulder pain on the R side. Midback is sometimes a dull ache. Pt states she will have radiating pain into the R flank. She states that the lower ribs are cramping sensation on the R and will happen when picking up heavy things. 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 Aggs: unknown; Eases: arching back, meds, rest, heat. Pt denies NT into the flank. Pt denies pain with bearing down, coughing, sneezing, laughing. Does not affect OH motion. Pt uses a computer 2-3 hours a day. 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 Pt denies cancer red flags. Best 1/10, current 0/10, worst 6/10    Patient Stated Goals flexibility, core strength, relief of pain and discomfort    Currently in Pain? Yes    Pain Score 2     Pain Location Back    Pain Orientation Right;Mid    Pain Descriptors / Indicators Aching;Dull;Sharp    Multiple Pain Sites Yes    Pain Score 3    Pain Location Neck    Pain Orientation Right    Pain Descriptors / Indicators Sharp;Dull    Pain Type Chronic pain    Pain Radiating Towards shoulder    Pain Onset More than a month ago    Pain Frequency Intermittent    Aggravating Factors  constant but from use/lifting will increase    Pain Relieving Factors massage    Effect of Pain on Daily Activities discomfort with daily activity              [facility] PT Assessment - 06/30/20 0001  Assessment   Medical Diagnosis Thoracic pain    Referring Provider (PT) Dr. Jonni Sanger    Hand Dominance Right    Prior Therapy Neck and LBP      Precautions   Precautions None      Restrictions   Weight Bearing Restrictions Yes      Balance Screen   Has the patient fallen in the past 6 months No    Has the patient had a decrease in activity level because of a fear of falling?  No    Is the patient reluctant to leave their home because of a fear of falling?  No      Home Ecologist residence      Prior Function   Level of Independence Independent      Cognition   Overall Cognitive Status Within Functional Limits for tasks assessed      Observation/Other Assessments   Other Surveys  Oswestry Disability Index    Oswestry Disability Index  12%      Posture/Postural Control    Posture/Postural Control Postural limitations    Postural Limitations Rounded Shoulders;Increased thoracic kyphosis      ROM / Strength   AROM / PROM / Strength AROM;PROM;Strength      AROM   Overall AROM Comments C/S WFL; midback pain with extension and stretching with flexion; T/S limited >30% in all directions pain into ribs with R rotation; catching sensation with L rotation; bilat shoulder ROM WFL      PROM   Overall PROM  Within functional limits for tasks performed      Strength   Overall Strength Comments 5/5 L UE, R UE 4+/5 for everything except flexion and ABD 4/5      Palpation   Spinal mobility decreased T/S spinal extension mobility from T2-10 CPA    Palpation comment Hypertonicity and TTP rhomboids, mid and lower trap, T/S paraspinals      Special Tests    Special Tests Cervical;Rotator Cuff Impingement    Cervical Tests Spurling's;Dictraction      Spurling's   Findings Positive      Distraction Test   Findngs Negative                                 PT Education - 06/30/20 2057    Education Details MOI, prognosis, diaghnosis, anatomy, daily movement variation, HEP, POC    Person(s) Educated Patient    Methods Explanation;Demonstration;Handout;Tactile cues;Verbal cues    Comprehension Verbalized understanding;Returned demonstration            PT Short Term Goals - 06/30/20 2101      PT SHORT TERM GOAL #1   Title Pt will become independent with HEP in order to demonstrate synthesis of PT education.    Time 2    Period Weeks    Status New      PT SHORT TERM GOAL #2   Title Pt will be able to demonstrate full T/S AROM without pain in order to demonstrate functional improvement in trunk ROM for return to self care, caregiver duties, and ADL.    Time 4    Period Weeks    Status New             PT Long Term Goals - 06/30/20 2102      PT LONG TERM GOAL #1   Title Pt will be able to carry/lift/row >45 lbs  without pain in  order to demonstrate functional improvement in R UE and core function for return to full pain free ADL and caregiver duties.    Time 6    Period Weeks    Status New      PT LONG TERM GOAL #2   Title Pt will be able to demonstrate full thoracic rotation and extension with carry and lifting tasks without pain in order to simulate return to PLOF.    Time 6    Period Weeks    Status New                 Plan - 06/30/20 2059    Clinical Impression Statement Pt is a 55 y.o. female presenting to PT eval today for cc of midback pain and neck pain. Pt presents with significantly limited T/S ROM, decreased joint mobility, pain with trunk rotation, and mild weakness of R UE. Pt's s/s are consistent with thoracic pain induced by regional hypomobility and muscle spasm with likely contribution from occupational and daily physical activity demands. Pt's neck pain is likely caused by hx of previous neck injury as well as C/S compensations for decreased T/S mobility. Pt's impairments limit her ability to full participate with occupation, ADL, and caregiver duties. Pt would benefit from continued skilled therapy in order to maximize functional trunk ROM and core stability and strength in order to prevent further decline and promote reaching of pt and PT goals.    Personal Factors and Comorbidities Age;Profession;Time since onset of injury/illness/exacerbation    Examination-Activity Limitations Carry;Lift;Reach Overhead;Other    Examination-Participation Restrictions Interpersonal Relationship;Occupation;Community Activity;Other;Yard Work    Stability/Clinical Decision Making Stable/Uncomplicated    Designer, jewellery Low    Rehab Potential Good    PT Frequency 2x / week    PT Duration 6 weeks    PT Treatment/Interventions ADLs/Self Care Home Management;Biofeedback;Electrical Stimulation;Iontophoresis 4mg /ml Dexamethasone;Moist Heat;Traction;Ultrasound;Therapeutic activities;Functional mobility  training;Therapeutic exercise;Neuromuscular re-education;Patient/family education;Manual techniques;Passive range of motion;Dry needling;Taping;Spinal Manipulations;Joint Manipulations    PT Next Visit Plan review HEP, self T/S mob, self massage, seated rotation mob    PT Home Exercise Plan [email or web address]    Consulted and Agree with Plan of Care Patient           Patient will benefit from skilled therapeutic intervention in order to improve the following deficits and impairments:  Hypomobility,Increased muscle spasms,Decreased range of motion,Pain,Impaired UE functional use,Decreased strength,Decreased activity tolerance  Visit Diagnosis: Pain in thoracic spine  Muscle weakness (generalized)  Cervicalgia     Problem List Patient Active Problem List   Diagnosis Date Noted  . Essential hypertension 06/23/2020  . Cholelithiasis 04/22/2019  . Family history of premature CAD 04/22/2019  . Obesity (BMI 30-39.9) 09/18/2017  . Hepatic steatosis mild by ultrasound 09/18/2017  . Hx of left hemicolectomy 09/18/2017  . Hx of microdiscectomy, L5-S1 09/18/2017  . Vitamin B12 deficiency 09/18/2017  . H/O colon cancer, stage III 09/05/2014  . GERD (gastroesophageal reflux disease) 07/03/2012  . Mixed hyperlipidemia 10/08/2011  . Seasonal allergies 10/08/2011    Daleen Bo PT, DPT 06/30/20 9:11 PM   [facility] [address] [place], [place], [address] Phone: [phone]   Fax:  [phone]  Name: Nicole Rosario MRN: [id number] Date of Birth: [birth year]

## 2020-06-30 NOTE — Patient Instructions (Signed)
Access Code: [email or web address] URL: [email or web address] Date: 06/30/2020 Prepared by: Daleen Bo  Exercises Standing Lower Cervical and Upper Thoracic Stretch - 1 x daily - 7 x weekly - 3 sets - 3 reps - 30 hold Sidelying Thoracic Rotation with Open Book - 2 x daily - 7 x weekly - 2 sets - 10 reps - 3 hold Wall Angels - 1 x daily - 7 x weekly - 2 sets - 10 reps

## 2020-07-05 ENCOUNTER — Other Ambulatory Visit: Payer: Self-pay

## 2020-07-05 ENCOUNTER — Encounter: Payer: Self-pay | Admitting: Physical Therapy

## 2020-07-05 ENCOUNTER — Ambulatory Visit (INDEPENDENT_AMBULATORY_CARE_PROVIDER_SITE_OTHER): Payer: 59 | Admitting: Physical Therapy

## 2020-07-05 DIAGNOSIS — M6281 Muscle weakness (generalized): Secondary | ICD-10-CM

## 2020-07-05 DIAGNOSIS — D1801 Hemangioma of skin and subcutaneous tissue: Secondary | ICD-10-CM | POA: Diagnosis not present

## 2020-07-05 DIAGNOSIS — L820 Inflamed seborrheic keratosis: Secondary | ICD-10-CM | POA: Diagnosis not present

## 2020-07-05 DIAGNOSIS — D2261 Melanocytic nevi of right upper limb, including shoulder: Secondary | ICD-10-CM | POA: Diagnosis not present

## 2020-07-05 DIAGNOSIS — M546 Pain in thoracic spine: Secondary | ICD-10-CM

## 2020-07-05 DIAGNOSIS — L814 Other melanin hyperpigmentation: Secondary | ICD-10-CM | POA: Diagnosis not present

## 2020-07-05 DIAGNOSIS — L821 Other seborrheic keratosis: Secondary | ICD-10-CM | POA: Diagnosis not present

## 2020-07-05 NOTE — Therapy (Signed)
[facility] [address] [place], [place], [address] Phone: [phone]   Fax:  [phone]  Physical Therapy Treatment  Patient Details  Name: Nicole Rosario MRN: [id number] Date of Birth: 1966-01-05 Referring Provider (PT): Dr. Jonni Sanger   Encounter Date: 07/05/2020   PT End of Session - 07/05/20 1411    Visit Number 2    Number of Visits 13    Date for PT Re-Evaluation 08/11/20    Authorization Type [employer]    PT Start Time 1300    PT Stop Time 1345    PT Time Calculation (min) 45 min    Activity Tolerance Patient tolerated treatment well    Behavior During Therapy [facility] for tasks assessed/performed           Past Medical History:  Diagnosis Date  . Cholelithiasis 04/22/2019  . Colon cancer Stage III at splenic flexure; followed at [facility]; s/p surgery and chemo. 04/09/2012  . Fatty liver 09/18/2017  . GERD (gastroesophageal reflux disease)   . Hyperlipidemia 10/08/2011  . Obesity (BMI 30-39.9) 09/18/2017  . Positive TB test   . Seasonal allergies 10/08/2011  . Vasomotor symptoms due to menopause 09/18/2017    Past Surgical History:  Procedure Laterality Date  . COLON SURGERY    . HEMICOLECTOMY  2013  . LUMBAR DISC SURGERY    . MICRODISCECTOMY LUMBAR  2019    There were no vitals filed for this visit.   Subjective Assessment - 07/05/20 1305    Subjective Pt states it is not catching as bad ad last session and the pain has reduced in intensity. No complaints with HEP other than wall angel being stiff.    Patient Stated Goals flexibility, core strength, relief of pain and discomfort    Currently in Pain? No/denies    Pain Score 0-No pain    Pain Onset More than a month ago                             [facility] Adult PT Treatment/Exercise - 07/05/20 0001      Posture/Postural Control   Posture/Postural Control Postural limitations    Postural Limitations Rounded Shoulders;Increased thoracic kyphosis       Exercises   Exercises Shoulder      Shoulder Exercises: Seated   Other Seated Exercises self T/S mob with towel 10x 3s      Shoulder Exercises: Sidelying   Other Sidelying Exercises open book stretch 10x 3s      Shoulder Exercises: Standing   Row 10 reps    Theraband Level (Shoulder Row) Level 3 (Green)    Other Standing Exercises wall angels 10x 3s      Shoulder Exercises: Stretch   Other Shoulder Stretches upper back stretch 30s 2x      Manual Therapy   Manual Therapy Joint mobilization;Soft tissue mobilization   Prone T3-10 PA grade IV STM R and L T/S paraspinals and rhomboids/middle trap                 PT Education - 07/05/20 1342    Education Details anatomy, daily movement variation, HEP    Person(s) Educated Patient    Methods Explanation;Demonstration;Tactile cues;Verbal cues    Comprehension Verbalized understanding;Returned demonstration            PT Short Term Goals - 06/30/20 2101      PT SHORT TERM GOAL #1   Title Pt will  become independent with HEP in order to demonstrate synthesis of PT education.    Time 2    Period Weeks    Status New      PT SHORT TERM GOAL #2   Title Pt will be able to demonstrate full T/S AROM without pain in order to demonstrate functional improvement in trunk ROM for return to self care, caregiver duties, and ADL.    Time 4    Period Weeks    Status New             PT Long Term Goals - 06/30/20 2102      PT LONG TERM GOAL #1   Title Pt will be able to carry/lift/row >45 lbs without pain in order to demonstrate functional improvement in R UE and core function for return to full pain free ADL and caregiver duties.    Time 6    Period Weeks    Status New      PT LONG TERM GOAL #2   Title Pt will be able to demonstrate full thoracic rotation and extension with carry and lifting tasks without pain in order to simulate return to PLOF.    Time 6    Period Weeks    Status New                 Plan  - 07/05/20 1411    Clinical Impression Statement Pt demonstrated improved T/S ROM as well as soft tissue extensibility following STM and joint mobilizations. Pt was able to demonstrate good form with HEP and required minimal cuing with previous exercise. Pt was able to progress T/S mobility exercise as well as resisted scapular retraction without increasing pain. Pt did need cuing for scapular positioning during rows. Pt gave verbal understanding to review of HEP update. Pt would benefit from continued skilled therapy in order to maximize functional trunk ROM and core stability and strength in order to prevent further decline and promote reaching of pt and PT goals.    Personal Factors and Comorbidities Age;Profession;Time since onset of injury/illness/exacerbation    Examination-Activity Limitations Carry;Lift;Reach Overhead;Other    Examination-Participation Restrictions Interpersonal Relationship;Occupation;Community Activity;Other;Yard Work    Stability/Clinical Decision Making Stable/Uncomplicated    Rehab Potential Good    PT Frequency 2x / week    PT Duration 6 weeks    PT Treatment/Interventions ADLs/Self Care Home Management;Biofeedback;Electrical Stimulation;Iontophoresis 4mg /ml Dexamethasone;Moist Heat;Traction;Ultrasound;Therapeutic activities;Functional mobility training;Therapeutic exercise;Neuromuscular re-education;Patient/family education;Manual techniques;Passive range of motion;Dry needling;Taping;Spinal Manipulations;Joint Manipulations    PT Next Visit Plan review HEP, self massage, seated rotation mob, lat stretch    PT Home Exercise Plan [email or web address]    Consulted and Agree with Plan of Care Patient           Patient will benefit from skilled therapeutic intervention in order to improve the following deficits and impairments:  Hypomobility,Increased muscle spasms,Decreased range of motion,Pain,Impaired UE functional use,Decreased strength,Decreased activity tolerance  Visit  Diagnosis: Pain in thoracic spine  Muscle weakness (generalized)     Problem List Patient Active Problem List   Diagnosis Date Noted  . Essential hypertension 06/23/2020  . Cholelithiasis 04/22/2019  . Family history of premature CAD 04/22/2019  . Obesity (BMI 30-39.9) 09/18/2017  . Hepatic steatosis mild by ultrasound 09/18/2017  . Hx of left hemicolectomy 09/18/2017  . Hx of microdiscectomy, L5-S1 09/18/2017  . Vitamin B12 deficiency 09/18/2017  . H/O colon cancer, stage III 09/05/2014  . GERD (gastroesophageal reflux disease) 07/03/2012  . Mixed hyperlipidemia 10/08/2011  . Seasonal  allergies 10/08/2011    Daleen Bo PT, DPT 07/05/20 2:32 PM   [facility] [address] [place], [place], [address] Phone: [phone]   Fax:  [phone]  Name: Nicole Rosario MRN: [id number] Date of Birth: [birth year]

## 2020-07-05 NOTE — Patient Instructions (Signed)
Access Code: [email or web address] URL: [email or web address] Date: 07/05/2020 Prepared by: Daleen Bo  Exercises Standing Lower Cervical and Upper Thoracic Stretch - 1 x daily - 7 x weekly - 3 sets - 3 reps - 30 hold Sidelying Thoracic Rotation with Open Book - 2 x daily - 7 x weekly - 2 sets - 10 reps - 3 hold Wall Angels - 1 x daily - 7 x weekly - 2 sets - 10 reps Seated Thoracic Lumbar Extension - 2 x daily - 7 x weekly - 1 sets - 10 reps - 3 hold Standing Shoulder Row with Anchored Resistance - 1 x daily - 7 x weekly - 3 sets - 10 reps

## 2020-07-11 ENCOUNTER — Encounter: Payer: Self-pay | Admitting: Physical Therapy

## 2020-07-11 ENCOUNTER — Ambulatory Visit (INDEPENDENT_AMBULATORY_CARE_PROVIDER_SITE_OTHER): Payer: 59 | Admitting: Physical Therapy

## 2020-07-11 ENCOUNTER — Other Ambulatory Visit: Payer: Self-pay

## 2020-07-11 DIAGNOSIS — M6281 Muscle weakness (generalized): Secondary | ICD-10-CM

## 2020-07-11 DIAGNOSIS — M546 Pain in thoracic spine: Secondary | ICD-10-CM

## 2020-07-11 NOTE — Patient Instructions (Signed)
Access Code: [email or web address] URL: [email or web address] Date: 07/11/2020 Prepared by: Daleen Bo  Exercises Standing Lower Cervical and Upper Thoracic Stretch - 1 x daily - 7 x weekly - 3 sets - 3 reps - 30 hold Sidelying Thoracic Rotation with Open Book - 2 x daily - 7 x weekly - 2 sets - 10 reps - 3 hold Wall Angels - 1 x daily - 7 x weekly - 2 sets - 10 reps Seated Thoracic Lumbar Extension - 2 x daily - 7 x weekly - 1 sets - 10 reps - 3 hold Standing Shoulder Row with Anchored Resistance - 1 x daily - 7 x weekly - 3 sets - 10 reps

## 2020-07-11 NOTE — Therapy (Addendum)
[facility] [address] [place], [place], [address] Phone: [phone]   Fax:  [phone]  Physical Therapy Discharge  Patient Details  Name: Nicole Rosario MRN: [id number] Date of Birth: 12/20/1965 Referring Provider (PT): Dr. Jonni Sanger   Encounter Date: 07/11/2020   PT End of Session - 07/11/20 1632    Visit Number 3    Number of Visits 13    Date for PT Re-Evaluation 08/11/20    Authorization Type [employer]    PT Start Time 1600    PT Stop Time 1645    PT Time Calculation (min) 45 min    Activity Tolerance Patient tolerated treatment well    Behavior During Therapy [facility] for tasks assessed/performed           Past Medical History:  Diagnosis Date  . Cholelithiasis 04/22/2019  . Colon cancer Stage III at splenic flexure; followed at [facility]; s/p surgery and chemo. 04/09/2012  . Fatty liver 09/18/2017  . GERD (gastroesophageal reflux disease)   . Hyperlipidemia 10/08/2011  . Obesity (BMI 30-39.9) 09/18/2017  . Positive TB test   . Seasonal allergies 10/08/2011  . Vasomotor symptoms due to menopause 09/18/2017    Past Surgical History:  Procedure Laterality Date  . COLON SURGERY    . HEMICOLECTOMY  2013  . LUMBAR DISC SURGERY    . MICRODISCECTOMY LUMBAR  2019    There were no vitals filed for this visit.   Subjective Assessment - 07/11/20 1606    Subjective Pt states the back pain has been better. She has decreased intensity of pain. Today, she has had some increased pain into the R side/ribs today but it is likely due to increased time sitting at a desk today.    Patient Stated Goals flexibility, core strength, relief of pain and discomfort    Currently in Pain? Yes    Pain Score 3     Pain Location Back    Pain Orientation Right;Mid    Pain Descriptors / Indicators Tightness    Pain Onset More than a month ago                             [facility] Adult PT Treatment/Exercise - 07/11/20 0001       Posture/Postural Control   Posture/Postural Control Postural limitations    Postural Limitations Rounded Shoulders;Increased thoracic kyphosis      Exercises   Exercises Shoulder      Shoulder Exercises: Supine   Other Supine Exercises quadruped rotation reach under 10x      Shoulder Exercises: Seated   Other Seated Exercises self T/S mob with towel 10x 3s    Other Seated Exercises Lat stretch 30s 3x      Shoulder Exercises: Sidelying   Other Sidelying Exercises open book stretch 10x 3s      Shoulder Exercises: Standing   Row 10 reps    Theraband Level (Shoulder Row) Level 3 (Green)    Other Standing Exercises wall angels 10x 3s      Shoulder Exercises: Stretch   Other Shoulder Stretches upper back stretch 30s 2x      Manual Therapy   Manual Therapy Joint mobilization;Soft tissue mobilization    Joint Mobilization CPA T2-10 grade IV-V    Soft tissue mobilization STM rhomboids, T/S para                  PT Education - 07/11/20 1629  Education Details anatomy, daily movement variation, HEP    Person(s) Educated Patient    Methods Explanation;Demonstration;Tactile cues;Verbal cues    Comprehension Verbalized understanding;Returned demonstration            PT Short Term Goals - 06/30/20 2101      PT SHORT TERM GOAL #1   Title Pt will become independent with HEP in order to demonstrate synthesis of PT education.    Time 2    Period Weeks    Status Not met     PT SHORT TERM GOAL #2   Title Pt will be able to demonstrate full T/S AROM without pain in order to demonstrate functional improvement in trunk ROM for return to self care, caregiver duties, and ADL.    Time 4    Period Weeks    Status Not met            PT Long Term Goals - 06/30/20 2102      PT LONG TERM GOAL #1   Title Pt will be able to carry/lift/row >45 lbs without pain in order to demonstrate functional improvement in R UE and core function for return to full pain free ADL and caregiver  duties.    Time 6    Period Weeks    Status Not met     PT LONG TERM GOAL #2   Title Pt will be able to demonstrate full thoracic rotation and extension with carry and lifting tasks without pain in order to simulate return to PLOF.    Time 6    Period Weeks    Status Not met                Plan - 07/11/20 1632    Clinical Impression Statement Pt presented with increased T/S limitiation and stiffness today that is likely due to increased time with static positioning related with work. Pt was able to increase thoracic mobility after STM, joint mob, and exercise. Pt continues to have L > R rotation limitation but is improving with extension bilaterally. Pt to decrease frequency of visits due to improvement with pain and being more stiff dominant. Pt would benefit from continued skilled therapy in order to maximize functional trunk ROM and core stability and strength in order to prevent further decline and promote reaching of pt and PT goals.    Personal Factors and Comorbidities Age;Profession;Time since onset of injury/illness/exacerbation    Examination-Activity Limitations Carry;Lift;Reach Overhead;Other    Examination-Participation Restrictions Interpersonal Relationship;Occupation;Community Activity;Other;Yard Work    Stability/Clinical Decision Making Stable/Uncomplicated    Rehab Potential Good    PT Frequency 2x / week    PT Duration 6 weeks    PT Treatment/Interventions ADLs/Self Care Home Management;Biofeedback;Electrical Stimulation;Iontophoresis 57m/ml Dexamethasone;Moist Heat;Traction;Ultrasound;Therapeutic activities;Functional mobility training;Therapeutic exercise;Neuromuscular re-education;Patient/family education;Manual techniques;Passive range of motion;Dry needling;Taping;Spinal Manipulations;Joint Manipulations    PT Next Visit Plan review HEP, self massage, seated rotation mob, seated rotation and SB    PT Home Exercise Plan [id number]    Consulted and Agree with Plan  of Care Patient           Patient will benefit from skilled therapeutic intervention in order to improve the following deficits and impairments:  Hypomobility,Increased muscle spasms,Decreased range of motion,Pain,Impaired UE functional use,Decreased strength,Decreased activity tolerance  Visit Diagnosis: Pain in thoracic spine  Muscle weakness (generalized)     Problem List Patient Active Problem List   Diagnosis Date Noted  . Essential hypertension 06/23/2020  . Cholelithiasis 04/22/2019  . Family history  of premature CAD 04/22/2019  . Obesity (BMI 30-39.9) 09/18/2017  . Hepatic steatosis mild by ultrasound 09/18/2017  . Hx of left hemicolectomy 09/18/2017  . Hx of microdiscectomy, L5-S1 09/18/2017  . Vitamin B12 deficiency 09/18/2017  . H/O colon cancer, stage III 09/05/2014  . GERD (gastroesophageal reflux disease) 07/03/2012  . Mixed hyperlipidemia 10/08/2011  . Seasonal allergies 10/08/2011    Daleen Bo PT, DPT 07/11/20 5:04 PM   [facility] [address] [place], [place], [address] Phone: [phone]   Fax:  [phone]  Name: MONEY MCKEITHAN MRN: [id number] Date of Birth: 12/17/1965   PHYSICAL THERAPY DISCHARGE SUMMARY  Visits from Start of Care: 3  Plan:                                                    Patient goals were not met. Patient is being discharged due to not returning since the last visit.  ?????

## 2020-07-12 ENCOUNTER — Encounter: Payer: 59 | Admitting: Physical Therapy

## 2020-07-15 DIAGNOSIS — Z0100 Encounter for examination of eyes and vision without abnormal findings: Secondary | ICD-10-CM | POA: Diagnosis not present

## 2020-07-19 ENCOUNTER — Encounter: Payer: 59 | Admitting: Physical Therapy

## 2020-07-20 ENCOUNTER — Encounter: Payer: 59 | Admitting: Physical Therapy

## 2020-07-27 ENCOUNTER — Encounter: Payer: 59 | Admitting: Physical Therapy

## 2020-08-03 ENCOUNTER — Encounter: Payer: 59 | Admitting: Physical Therapy

## 2020-09-30 IMAGING — CT CT NECK W/ CM
4 series · 15 of 33 positions shown, 18 images · IV contrast (APPLIED)
Comparison: None.

CLINICAL DATA: Recurrent sinus infections. Right-sided
submandibular swelling. Sore throat.

EXAM:
CT NECK WITH CONTRAST
TECHNIQUE: Multidetector CT imaging of the neck was performed using the
standard protocol following the bolus administration of intravenous
contrast.
CONTRAST:  75mL OMNIPAQUE IOHEXOL 300 MG/ML  SOLN

[Series 3: neck 2.0 i31s 3 · axial · 0.40mm/px · z∈[-218,-56]mm · 5 of 123 slices shown, 7 images]
[im 21/123  soft-tissue]
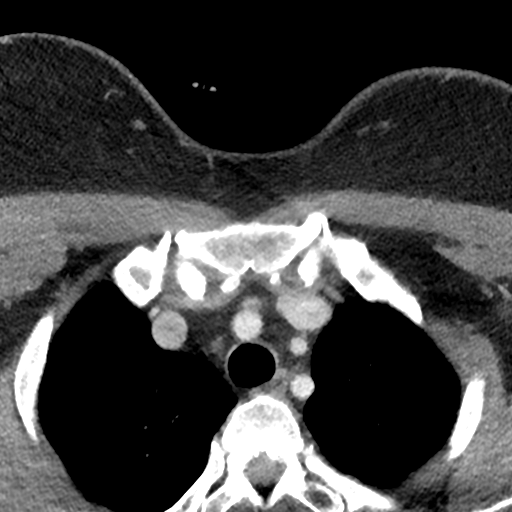
[im 21/123  bone]
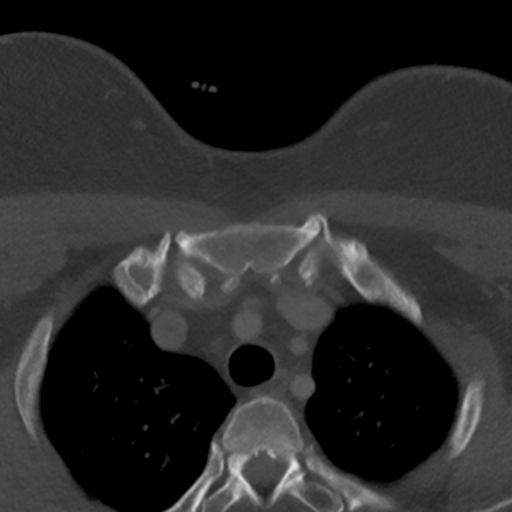
[im 41/123  bone]
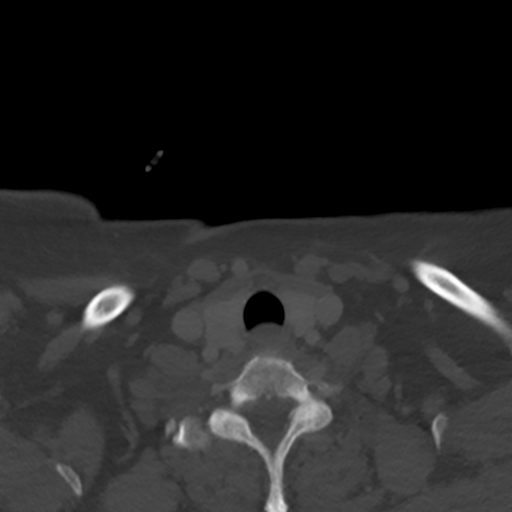
[im 62/123  bone]
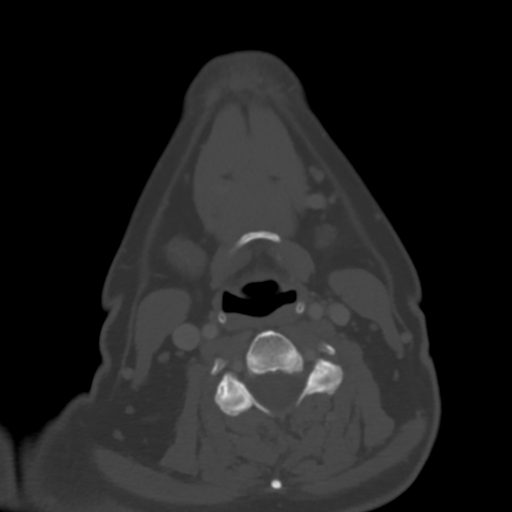
[im 82/123  bone]
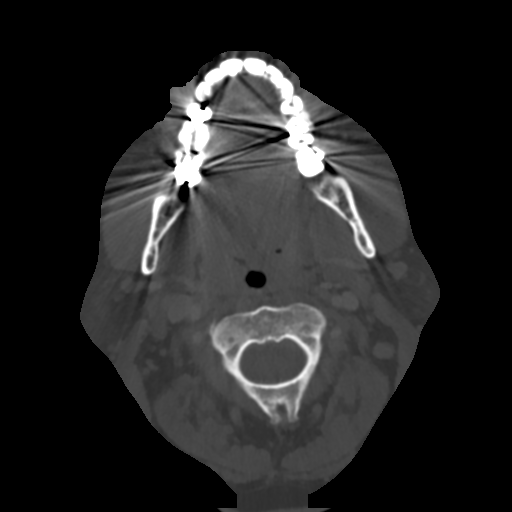
[im 102/123  soft-tissue]
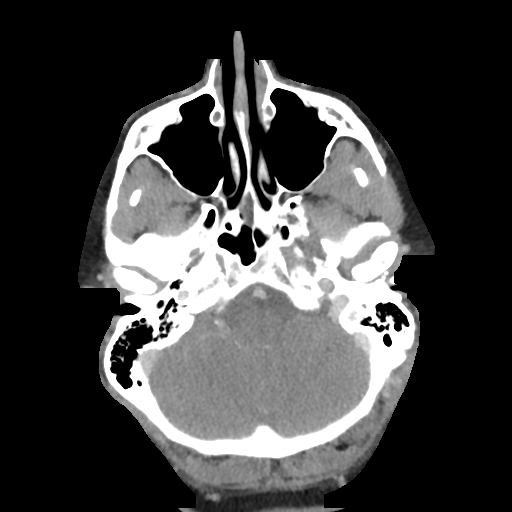
[im 102/123  bone]
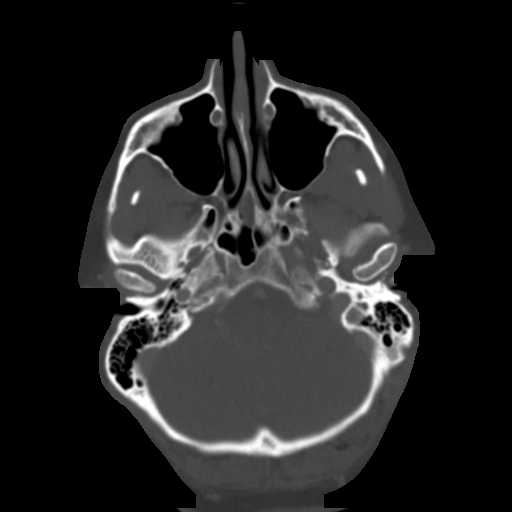

[Series 7: coronal st · coronal · 0.48mm/px · 3 of 101 slices shown]
[im 21/101  bone]
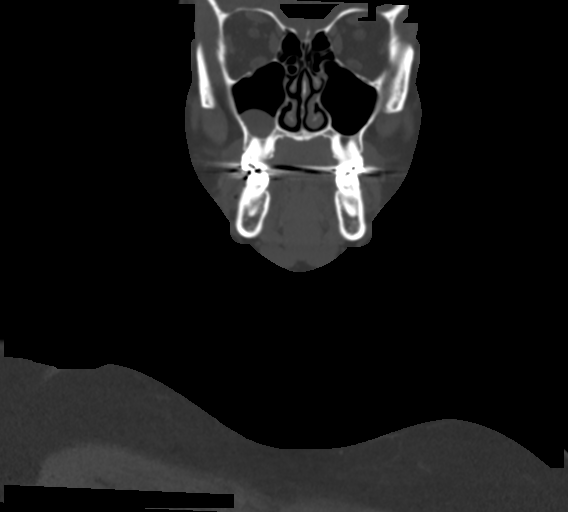
[im 41/101  bone]
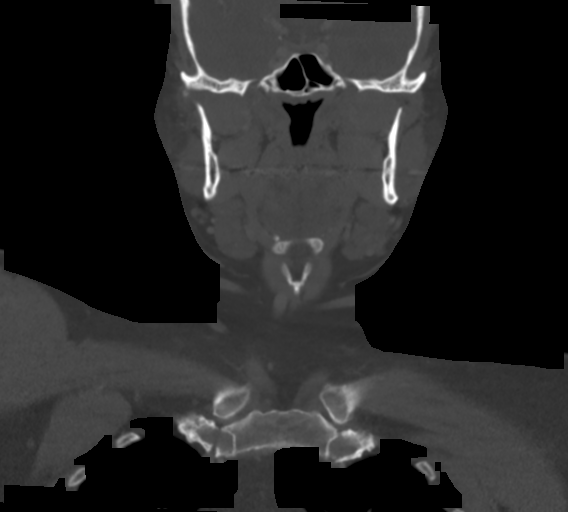
[im 61/101  bone]
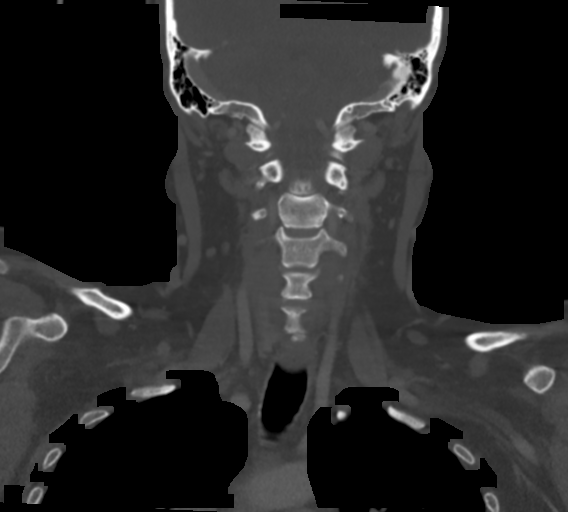

[Series 8: sagittal st · sagittal · 0.48mm/px · 5 of 101 slices shown, 6 images]
[im 34/101  bone]
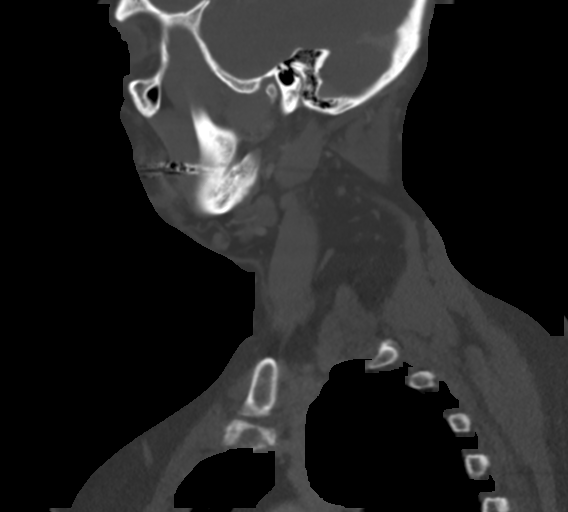
[im 42/101  bone]
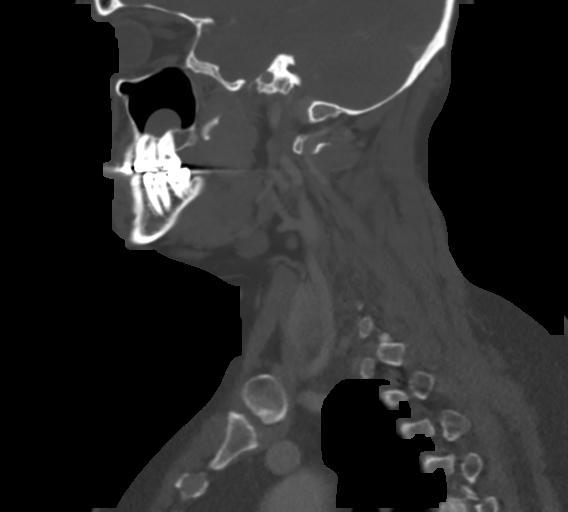
[im 51/101  soft-tissue]
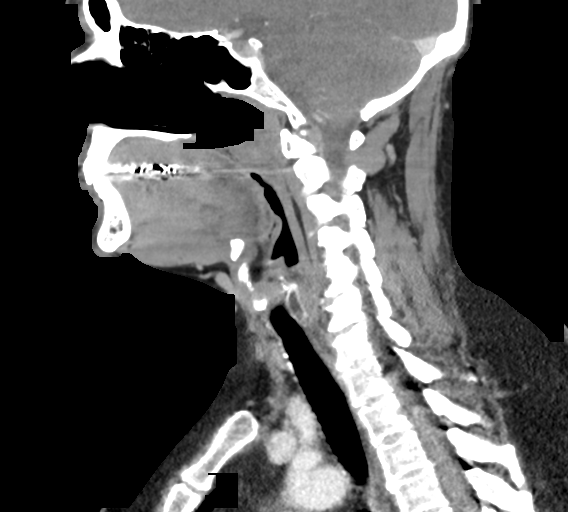
[im 51/101  bone]
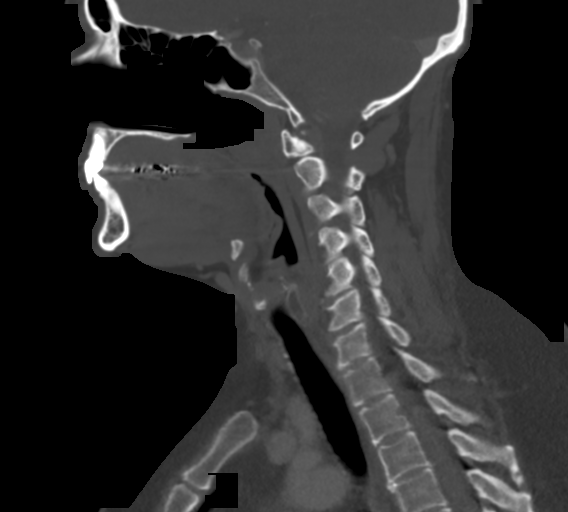
[im 59/101  bone]
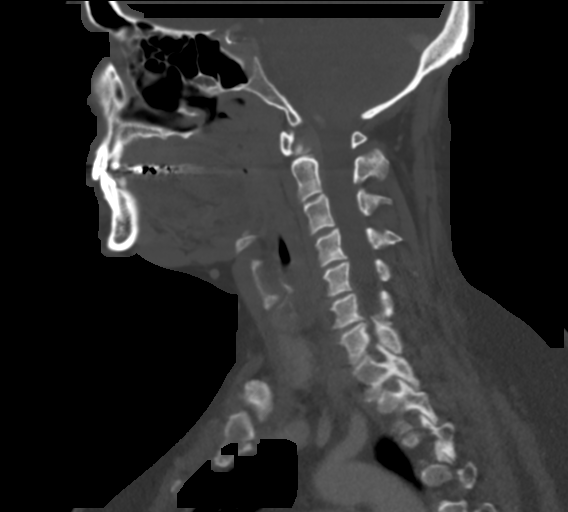
[im 67/101  bone]
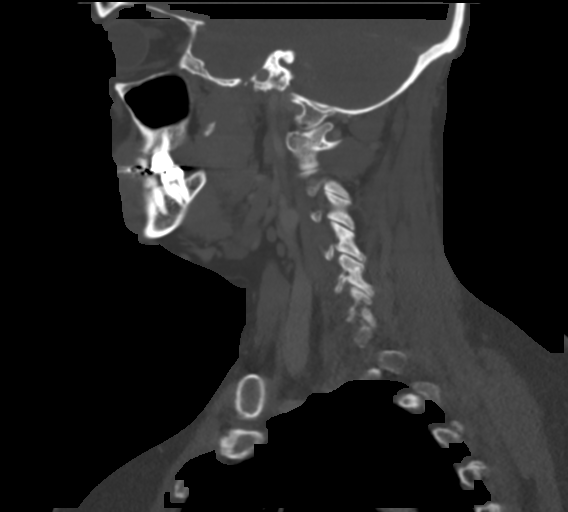

[Series 9: orthogonal st · axial · 0.39mm/px · z∈[-246,-207]mm · 2 of 121 slices shown]
[im 21/121  bone]
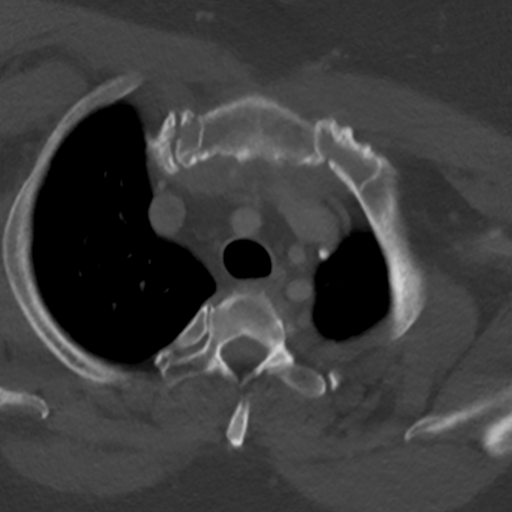
[im 41/121  bone]
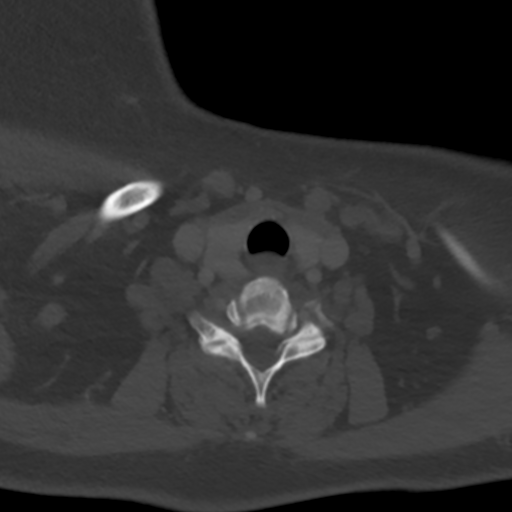

[15 of 33 positions shown; findings below may reference images not displayed]

FINDINGS: Pharynx and larynx: No focal mucosal or submucosal lesion. Question
mild pharyngitis. No evidence of discernible tonsillitis or
peritonsillar abscess.

Salivary glands: Parotid and submandibular glands are normal. No
evidence of inflammation, stone or mass.

Thyroid: Normal

Lymph nodes: No enlarged or low-density nodes on either side of the
neck. Normal bilateral nodes.

Vascular: No abnormal vascular finding.

Limited intracranial: Normal

Visualized orbits: Normal

Mastoids and visualized paranasal sinuses: Clear

Skeleton: Mild cervical spondylosis.

Upper chest: Normal

Other: None
IMPRESSION: No cause of submandibular swelling identified. Possible mild
pharyngitis without evidence of advanced inflammatory change. The
submandibular glands appear normal and symmetric. No evidence of
regional lymphadenopathy.

## 2020-10-11 ENCOUNTER — Other Ambulatory Visit: Payer: Self-pay

## 2020-10-11 ENCOUNTER — Other Ambulatory Visit (HOSPITAL_BASED_OUTPATIENT_CLINIC_OR_DEPARTMENT_OTHER): Payer: Self-pay

## 2020-10-11 ENCOUNTER — Ambulatory Visit: Payer: 59 | Attending: Internal Medicine

## 2020-10-11 DIAGNOSIS — Z23 Encounter for immunization: Secondary | ICD-10-CM

## 2020-10-11 MED ORDER — PFIZER-BIONT COVID-19 VAC-TRIS 30 MCG/0.3ML IM SUSP
INTRAMUSCULAR | 0 refills | Status: DC
  Filled 2020-10-11: qty 0.3, 1d supply, fill #0

## 2020-10-11 NOTE — Progress Notes (Signed)
   Covid-19 Vaccination Clinic  Name:  [name]    MRN: [id number] DOB: 02-01-1966  10/11/2020  [name] was observed post Covid-19 immunization for 15 minutes without incident. She was provided with Vaccine Information Sheet and instruction to access the V-Safe system.   [name] was instructed to call 911 with any severe reactions post vaccine: Difficulty breathing  Swelling of face and throat  A fast heartbeat  A bad rash all over body  Dizziness and weakness   Immunizations Administered     Name Date Dose VIS Date Route   PFIZER Comrnaty(Gray TOP) Covid-19 Vaccine 10/11/2020  1:20 PM 0.3 mL 03/17/2020 Intramuscular   Manufacturer: [place]   Lot: [id number]   [place]: [phone]

## 2020-10-26 ENCOUNTER — Other Ambulatory Visit (HOSPITAL_COMMUNITY): Payer: Self-pay

## 2020-10-26 MED FILL — Cyclobenzaprine HCl Tab 10 MG: ORAL | 10 days supply | Qty: 30 | Fill #0 | Status: AC

## 2020-10-26 MED FILL — Methocarbamol Tab 500 MG: ORAL | 15 days supply | Qty: 60 | Fill #0 | Status: AC

## 2020-11-23 ENCOUNTER — Encounter: Payer: 59 | Admitting: Family Medicine

## 2020-11-28 MED FILL — Lisinopril Tab 10 MG: ORAL | 90 days supply | Qty: 90 | Fill #0 | Status: AC

## 2020-11-29 ENCOUNTER — Other Ambulatory Visit (HOSPITAL_COMMUNITY): Payer: Self-pay

## 2020-12-14 ENCOUNTER — Ambulatory Visit (INDEPENDENT_AMBULATORY_CARE_PROVIDER_SITE_OTHER): Payer: 59 | Admitting: Family Medicine

## 2020-12-14 ENCOUNTER — Encounter: Payer: Self-pay | Admitting: Family Medicine

## 2020-12-14 ENCOUNTER — Other Ambulatory Visit (HOSPITAL_COMMUNITY): Payer: Self-pay

## 2020-12-14 ENCOUNTER — Other Ambulatory Visit: Payer: Self-pay

## 2020-12-14 VITALS — BP 146/90 | HR 86 | Temp 98.4°F | Ht 63.0 in | Wt 195.0 lb

## 2020-12-14 DIAGNOSIS — K219 Gastro-esophageal reflux disease without esophagitis: Secondary | ICD-10-CM | POA: Diagnosis not present

## 2020-12-14 DIAGNOSIS — Z85038 Personal history of other malignant neoplasm of large intestine: Secondary | ICD-10-CM

## 2020-12-14 DIAGNOSIS — Z0000 Encounter for general adult medical examination without abnormal findings: Secondary | ICD-10-CM | POA: Diagnosis not present

## 2020-12-14 DIAGNOSIS — I10 Essential (primary) hypertension: Secondary | ICD-10-CM

## 2020-12-14 DIAGNOSIS — K760 Fatty (change of) liver, not elsewhere classified: Secondary | ICD-10-CM | POA: Diagnosis not present

## 2020-12-14 DIAGNOSIS — M546 Pain in thoracic spine: Secondary | ICD-10-CM | POA: Diagnosis not present

## 2020-12-14 DIAGNOSIS — E782 Mixed hyperlipidemia: Secondary | ICD-10-CM | POA: Diagnosis not present

## 2020-12-14 DIAGNOSIS — E669 Obesity, unspecified: Secondary | ICD-10-CM | POA: Diagnosis not present

## 2020-12-14 DIAGNOSIS — Z23 Encounter for immunization: Secondary | ICD-10-CM

## 2020-12-14 LAB — CBC WITH DIFFERENTIAL/PLATELET
Basophils Absolute: 0.1 10*3/uL (ref 0.0–0.1)
Basophils Relative: 1.3 % (ref 0.0–3.0)
Eosinophils Absolute: 0.2 10*3/uL (ref 0.0–0.7)
Eosinophils Relative: 5.1 % — ABNORMAL HIGH (ref 0.0–5.0)
HCT: 41 % (ref 36.0–46.0)
Hemoglobin: 13.9 g/dL (ref 12.0–15.0)
Lymphocytes Relative: 39.6 % (ref 12.0–46.0)
Lymphs Abs: 1.9 10*3/uL (ref 0.7–4.0)
MCHC: 33.8 g/dL (ref 30.0–36.0)
MCV: 89.1 fl (ref 78.0–100.0)
Monocytes Absolute: 0.3 10*3/uL (ref 0.1–1.0)
Monocytes Relative: 6.7 % (ref 3.0–12.0)
Neutro Abs: 2.2 10*3/uL (ref 1.4–7.7)
Neutrophils Relative %: 47.3 % (ref 43.0–77.0)
Platelets: 238 10*3/uL (ref 150.0–400.0)
RBC: 4.6 Mil/uL (ref 3.87–5.11)
RDW: 12.9 % (ref 11.5–15.5)
WBC: 4.7 10*3/uL (ref 4.0–10.5)

## 2020-12-14 LAB — LIPID PANEL
Cholesterol: 209 mg/dL — ABNORMAL HIGH (ref 0–200)
HDL: 43 mg/dL (ref >39.00)
NonHDL: 166.12
Total CHOL/HDL Ratio: 5
Triglycerides: 251 mg/dL — ABNORMAL HIGH (ref 0.0–149.0)
VLDL: 50.2 mg/dL — ABNORMAL HIGH (ref 0.0–40.0)

## 2020-12-14 LAB — COMPREHENSIVE METABOLIC PANEL
ALT: 40 U/L — ABNORMAL HIGH (ref 0–35)
AST: 24 U/L (ref 0–37)
Albumin: 4.2 g/dL (ref 3.5–5.2)
Alkaline Phosphatase: 67 U/L (ref 39–117)
BUN: 13 mg/dL (ref 6–23)
CO2: 25 mEq/L (ref 19–32)
Calcium: 9.2 mg/dL (ref 8.4–10.5)
Chloride: 106 mEq/L (ref 96–112)
Creatinine, Ser: 0.63 mg/dL (ref 0.40–1.20)
GFR: 101.1 mL/min (ref >60.00)
Glucose, Bld: 129 mg/dL — ABNORMAL HIGH (ref 70–99)
Potassium: 3.7 mEq/L (ref 3.5–5.1)
Sodium: 140 mEq/L (ref 135–145)
Total Bilirubin: 0.3 mg/dL (ref 0.2–1.2)
Total Protein: 6.8 g/dL (ref 6.0–8.3)

## 2020-12-14 LAB — VITAMIN D 25 HYDROXY (VIT D DEFICIENCY, FRACTURES): VITD: 61.22 ng/mL (ref 30.00–100.00)

## 2020-12-14 LAB — LDL CHOLESTEROL, DIRECT: Direct LDL: 125 mg/dL

## 2020-12-14 LAB — VITAMIN B12: Vitamin B-12: 314 pg/mL (ref 211–911)

## 2020-12-14 MED ORDER — LISINOPRIL 20 MG PO TABS
20.0000 mg | ORAL_TABLET | Freq: Every day | ORAL | 3 refills | Status: DC
  Filled 2020-12-14: qty 90, 90d supply, fill #0

## 2020-12-14 MED ORDER — CYCLOBENZAPRINE HCL 10 MG PO TABS
10.0000 mg | ORAL_TABLET | Freq: Every evening | ORAL | 2 refills | Status: AC | PRN
  Filled 2020-12-14: qty 30, 30d supply, fill #0
  Filled 2021-03-10: qty 30, 30d supply, fill #1
  Filled 2021-10-09: qty 30, 30d supply, fill #2

## 2020-12-14 MED ORDER — METHOCARBAMOL 500 MG PO TABS
500.0000 mg | ORAL_TABLET | Freq: Four times a day (QID) | ORAL | 2 refills | Status: AC | PRN
  Filled 2020-12-14: qty 30, 8d supply, fill #0
  Filled 2021-03-10: qty 30, 8d supply, fill #1
  Filled 2021-06-07: qty 30, 8d supply, fill #2

## 2020-12-14 NOTE — Progress Notes (Signed)
Subjective  Chief Complaint  Patient presents with   Annual Exam    Not fasting   Hypertension   Hyperlipidemia   Gastroesophageal Reflux    HPI: Nicole Rosario is a 55 y.o. female who presents to [facility] today for a Female Wellness Visit. She also has the concerns and/or needs as listed above in the chief complaint. These will be addressed in addition to the Health Maintenance Visit.   Wellness Visit: annual visit with health maintenance review and exam without Pap  Health maintenance: Pap smear normal and up-to-date.  Due mammogram next month.  History of colon cancer and follows with oncology regularly.  Imaging and colonoscopies are up-to-date.  Overall feeling well.  Due second Shingrix vaccination today and eligible for flu shot but defers till next month Chronic disease f/u and/or acute problem visit: (deemed necessary to be done in addition to the wellness visit): Hypertension: elevated today. Hasn't been checking at home; on lisinopril 10 daily. No cp or sob or leg edema. Feels well. Compliance is good w/o AEs Hyperlipidemia: had been on statin. H/o hypertrigs. Non fasting today.  Hepatic steatosis and obesity: hasn't been able to eat well; mom passed in late July after being on  hospice care ([facility]). Pt had been juggling stress, caring for mom and work. Weight is up a few pounds.  Thoracic msk back pain: was improving w/ PT but had to stop due to mother's illness. Uses meloxicam intermittently. Requests refills of mm relaxers for prn use.  GERd is well controlled on chronic PPI. H/o b12 deficiency. No longer on supplements Vit D takes 5000 units daily.   Assessment  1. Annual physical exam   2. Essential hypertension   3. H/O colon cancer, stage III   4. Mixed hyperlipidemia   5. Hepatic steatosis mild by ultrasound   6. Obesity (BMI 30-39.9)   7. Pain in thoracic spine   8. Gastroesophageal reflux disease without esophagitis      Plan   Female Wellness Visit: Age appropriate Health Maintenance and Prevention measures were discussed with patient. Included topics are cancer screening recommendations, ways to keep healthy (see AVS) including dietary and exercise recommendations, regular eye and dental care, use of seat belts, and avoidance of moderate alcohol use and tobacco use. Pt to schedule mammogram. Other screens are up to date.  BMI: discussed patient's BMI and encouraged positive lifestyle modifications to help get to or maintain a target BMI. HM needs and immunizations were addressed and ordered. See below for orders. See HM and immunization section for updates. 2nd shingrix today; had high fever with first Routine labs and screening tests ordered including cmp, cbc and lipids where appropriate. Discussed recommendations regarding Vit D and calcium supplementation (see AVS)  Chronic disease management visit and/or acute problem visit: HTN: marginal control. Increase lisinopril to 20 daily. Check renal function and electrolytes.  Recheck lipids.  Rec low fat diet and start exercise if able.  Jerrye Bushy is controlled. Continue omeprazole 50m daily. Check b12 levles.  On high dose vit d supp; recheck levels.  Back pain: can return to PT when able. Stretching and prn robaxin 5027f mm spasms duruing the day. Flexeril 10 at night if needed.   Follow up: 6 mo for bp recheck.   Orders Placed This Encounter  Procedures   Vitamin B12   VITAMIN D 25 Hydroxy (Vit-D Deficiency, Fractures)   CBC with Differential/Platelet   Comprehensive metabolic panel   Lipid panel  Meds ordered this encounter  Medications   cyclobenzaprine (FLEXERIL) 10 MG tablet    Sig: Take 1 tablet (10 mg total) by mouth at bedtime as needed.    Dispense:  30 tablet    Refill:  2   methocarbamol (ROBAXIN) 500 MG tablet    Sig: Take 1 tablet (500 mg total) by mouth every 6 (six) hours as needed for muscle spasms.    Dispense:  30 tablet    Refill:  2    lisinopril (ZESTRIL) 20 MG tablet    Sig: Take 1 tablet (20 mg total) by mouth daily.    Dispense:  90 tablet    Refill:  3       Body mass index is 34.54 kg/m. Wt Readings from Last 3 Encounters:  12/14/20 195 lb (88.5 kg)  06/23/20 191 lb 6.1 oz (86.8 kg)  11/23/19 191 lb 9.6 oz (86.9 kg)     Patient Active Problem List   Diagnosis Date Noted   Essential hypertension 06/23/2020    Priority: High   Family history of premature CAD 04/22/2019    Priority: High   Obesity (BMI 30-39.9) 09/18/2017    Priority: High   H/O colon cancer, stage III 09/05/2014    Priority: High   Hepatic steatosis mild by ultrasound 09/18/2017    Priority: Medium    Hepatic steatosis and family history of primary biliary cholangitis in mother. Her hepatologist recommends screening with mitochondrial antibodies every 2 years. 2021 done     Hx of left hemicolectomy 09/18/2017    Priority: Medium   Hx of microdiscectomy, L5-S1 09/18/2017    Priority: Medium   GERD (gastroesophageal reflux disease) 07/03/2012    Priority: Medium   Mixed hyperlipidemia 10/08/2011    Priority: Medium   Cholelithiasis 04/22/2019    Priority: Low    Asymptomatic. Incidental finding on ultrasound    Seasonal allergies 10/08/2011    Priority: [place] Maintenance  Topic Date Due   Pneumococcal Vaccine 48-70 Years old (1 - PCV) Never done   Zoster Vaccines- Shingrix (2 of 2) 08/18/2020   INFLUENZA VACCINE  11/07/2020   MAMMOGRAM  12/15/2020   COVID-19 Vaccine (5 - Booster for Pfizer series) 02/11/2021   COLONOSCOPY (Pts 45-13yr Insurance coverage will need to be confirmed)  10/19/2021   PAP SMEAR-Modifier  11/27/2023   TETANUS/TDAP  12/05/2027   Hepatitis C Screening  Completed   HIV Screening  Completed   HPV VACCINES  Aged Out   Immunization History  Administered Date(s) Administered   Hepatitis B 04/09/1993   Hepatitis B, adult 04/09/1993   Influenza-Unspecified 01/08/2015, 01/23/2018,  02/01/2019, 01/08/2020   MMR 04/09/1988   PFIZER Comirnaty(Gray Top)Covid-19 Tri-Sucrose Vaccine 10/11/2020   PFIZER(Purple Top)SARS-COV-2 Vaccination 04/04/2019, 04/22/2019, 01/29/2020   Td 12/04/2017   Tdap 04/10/2003   Zoster Recombinat (Shingrix) 06/23/2020   We updated and reviewed the patient's past history in detail and it is documented below. Allergies: Patient is allergic to erythromycin. Past Medical History Patient  has a past medical history of Cholelithiasis (04/22/2019), Colon cancer Stage III at splenic flexure; followed at [facility] s/p surgery and chemo. (04/09/2012), Fatty liver (09/18/2017), GERD (gastroesophageal reflux disease), Hyperlipidemia (10/08/2011), Obesity (BMI 30-39.9) (09/18/2017), Positive TB test, Seasonal allergies (10/08/2011), and Vasomotor symptoms due to menopause (09/18/2017). Past Surgical History Patient  has a past surgical history that includes Colon surgery; Microdiscectomy lumbar (2019); Hemicolectomy (2013); and Lumbar disc surgery. Family History: Patient family history includes Cancer in her paternal grandmother;  Colon cancer in her maternal grandmother; Heart disease in her maternal aunt, maternal grandfather, and mother; Hypertension in her father; Lupus in her sister; Other in her mother; Parkinson's disease in her mother. Social History:  Patient  reports that she has quit smoking. She has never used smokeless tobacco. She reports current alcohol use. She reports that she does not use drugs.  Review of Systems: Constitutional: negative for fever or malaise Ophthalmic: negative for photophobia, double vision or loss of vision Cardiovascular: negative for chest pain, dyspnea on exertion, or new LE swelling Respiratory: negative for SOB or persistent cough Gastrointestinal: negative for abdominal pain, change in bowel habits or melena Genitourinary: negative for dysuria or gross hematuria, no abnormal uterine bleeding or disharge Musculoskeletal:  negative for new gait disturbance or muscular weakness Integumentary: negative for new or persistent rashes, no breast lumps Neurological: negative for TIA or stroke symptoms Psychiatric: negative for SI or delusions Allergic/Immunologic: negative for hives  Patient Care Team    Relationship Specialty Notifications Start End  Leamon Arnt, MD PCP - General Family Medicine  04/22/19   Erline Levine, MD Consulting Physician Neurosurgery  03/31/18   Crawford Givens, MD Consulting Physician Obstetrics and Gynecology  03/31/18   Helayne Seminole, MD Consulting Physician Otolaryngology  05/16/18   Caralee Ates, PA-C Consulting Physician Hematology and Oncology  06/23/20     Objective  Vitals: BP (!) 146/90   Pulse 86   Temp 98.4 F (36.9 C)   Ht 5' 3"  (1.6 m)   Wt 195 lb (88.5 kg)   SpO2 96%   BMI 34.54 kg/m  General:  Well developed, well nourished, no acute distress  Psych:  Alert and orientedx3,normal mood and affect HEENT:  Normocephalic, atraumatic, non-icteric sclera,  supple neck without adenopathy, mass or thyromegaly Cardiovascular:  Normal S1, S2, RRR without gallop, rub or murmur Respiratory:  Good breath sounds bilaterally, CTAB with normal respiratory effort Gastrointestinal: normal bowel sounds, soft, non-tender, no noted masses. No HSM MSK: no deformities, contusions. Joints are without erythema or swelling.  Skin:  Warm, no rashes or suspicious lesions noted Neurologic:    Mental status is normal.  Gross motor and sensory exams are normal. Normal gait. No tremor   Commons side effects, risks, benefits, and alternatives for medications and treatment plan prescribed today were discussed, and the patient expressed understanding of the given instructions. Patient is instructed to call or message via MyChart if he/she has any questions or concerns regarding our treatment plan. No barriers to understanding were identified. We discussed Red Flag symptoms and signs in  detail. Patient expressed understanding regarding what to do in case of urgent or emergency type symptoms.  Medication list was reconciled, printed and provided to the patient in AVS. Patient instructions and summary information was reviewed with the patient as documented in the AVS. This note was prepared with assistance of Dragon voice recognition software. Occasional wrong-word or sound-a-like substitutions may have occurred due to the inherent limitations of voice recognition software  This visit occurred during the SARS-CoV-2 public health emergency.  Safety protocols were in place, including screening questions prior to the visit, additional usage of staff PPE, and extensive cleaning of exam room while observing appropriate contact time as indicated for disinfecting solutions.

## 2020-12-14 NOTE — Addendum Note (Signed)
Addended by: Gean Birchwood on: 12/14/2020 10:50 AM   Modules accepted: Orders

## 2020-12-14 NOTE — Patient Instructions (Signed)
Please return in 6 months for hypertension follow up.   I will release your lab results to you on your MyChart account with further instructions. Please reply with any questions.    Today you were given your 2nd shingrix vaccination.    If you have any questions or concerns, please don't hesitate to send me a message via MyChart or call the office at [phone]. Thank you for visiting with [place] today! It's our pleasure caring for you.

## 2021-02-20 ENCOUNTER — Encounter: Payer: Self-pay | Admitting: Family Medicine

## 2021-02-20 ENCOUNTER — Telehealth: Payer: Self-pay

## 2021-02-20 NOTE — Telephone Encounter (Signed)
fyi

## 2021-02-20 NOTE — Telephone Encounter (Signed)
FYI

## 2021-02-20 NOTE — Telephone Encounter (Signed)
Patient is scheduled for tomorrow.   Nurse Assessment Nurse: Fredderick Phenix, RN, [name] Date/Time Nicole Rosario Time): 02/20/2021 10:44:59 AM Confirm and document reason for call. If symptomatic, describe symptoms. ---Caller states she is having heart palpations, states she is having PVCs. Started two weeks ago.  Does the patient have any new or worsening symptoms? ---Yes Will a triage be completed? ---Yes Related visit to physician within the last 2 weeks? ---No Does the PT have any chronic conditions? (i.e. diabetes, asthma, this includes High risk factors for pregnancy, etc.) ---Yes List chronic conditions. ---HTN Is the patient pregnant or possibly pregnant? (Ask all females between the ages of 41-55) ---No Is this a behavioral health or substance abuse call? ---No  Heart Rate and Heartbeat Questions [1] Skipped or extra beat(s) [2] occurs 4 or more times per minute Fredderick Phenix, RNLelan Pons 02/20/2021 10:46:17 AM  02/20/2021 10:48:54 AM See HCP within 4 Hours (or PCP triage)

## 2021-02-21 ENCOUNTER — Encounter: Payer: Self-pay | Admitting: Physician Assistant

## 2021-02-21 ENCOUNTER — Other Ambulatory Visit: Payer: Self-pay

## 2021-02-21 ENCOUNTER — Ambulatory Visit (INDEPENDENT_AMBULATORY_CARE_PROVIDER_SITE_OTHER): Payer: 59

## 2021-02-21 ENCOUNTER — Ambulatory Visit: Payer: 59 | Admitting: Physician Assistant

## 2021-02-21 VITALS — BP 130/74 | HR 85 | Temp 98.2°F | Ht 63.0 in | Wt 201.0 lb

## 2021-02-21 DIAGNOSIS — R002 Palpitations: Secondary | ICD-10-CM | POA: Diagnosis not present

## 2021-02-21 NOTE — Progress Notes (Unsigned)
Enrolled patient for a 3 day ZIo XT monitor to be mailed to patients home  

## 2021-02-21 NOTE — Patient Instructions (Signed)
It was great to see you!  We will update blood work today  You will be contacted by cardiology about your zio patch monitor  If new/worsening symptoms, please reach out to [place] or go to ER if severe symptoms  Take care,  Inda Coke PA-C

## 2021-02-21 NOTE — Progress Notes (Signed)
Nicole Rosario is a 55 y.o. female here for heart palpitations.   History of Present Illness:   Chief Complaint  Patient presents with   Palpitations    Pt has been having palpitations everyday for the past 2 weeks   HPI  Heart Palpitations Nicole Rosario presents with c/o what she believes to be premature ventricular contractions that have been onset everyday for a week and a half. Ms. Dona reports these contractions occur as low as 2-3 per minute and as high as 16 per minute, occasionally coupled. Initially she believed they were caused by caffeine intake but found they were still occurring even after she halted her caffeine use. She also thought it was related to her HTN,but she has been compliant with lisiniopril 20 mg and receiving normal readings with systolic ranging from 161'W to 960'A and diastolic ranging from 54'U to 80's.  Nicole Rosario reports she has had something similar about 20 years ago which led her to visiting a cardiologist. During her visit with cardiology, she was dx to have PVCs and was prescribed to take a beta blocker prn. Since then she hasn't had any issues similar to this problem until two week ago. Nicole Rosario does report a fhx of Afib in her mother and father. Denies CP, lightheadedness, nausea, dizziness, or changes in salt/alcohol intake.   Past Medical History:  Diagnosis Date   Cholelithiasis 04/22/2019   Colon cancer Stage III at splenic flexure; followed at [facility]; s/p surgery and chemo. 04/09/2012   Fatty liver 09/18/2017   GERD (gastroesophageal reflux disease)    Hyperlipidemia 10/08/2011   Obesity (BMI 30-39.9) 09/18/2017   Positive TB test    Seasonal allergies 10/08/2011   Vasomotor symptoms due to menopause 09/18/2017     Social History   Tobacco Use   Smoking status: Former   Smokeless tobacco: Never  Scientific laboratory technician Use: Never used  Substance Use Topics   Alcohol use: Yes    Alcohol/week: 0.0 standard drinks    Comment: social   Drug use: Never    Past Surgical  History:  Procedure Laterality Date   COLON SURGERY     HEMICOLECTOMY  2013   LUMBAR [place] SURGERY     MICRODISCECTOMY LUMBAR  2019    Family History  Problem Relation Age of Onset   Heart disease Mother    Parkinson's disease Mother    Other Mother        primary biliary sclerosis   Hypertension Father    Lupus Sister    Colon cancer Maternal Grandmother    Heart disease Maternal Grandfather    Cancer Paternal Grandmother    Heart disease Maternal Aunt     Allergies  Allergen Reactions   Erythromycin Nausea Only and Nausea And Vomiting    Current Medications:   Current Outpatient Medications:    aspirin EC 81 MG tablet, Take 1 tablet by mouth daily., Disp: , Rfl:    Cetirizine HCl 10 MG CAPS, Take 10 mg by mouth daily., Disp: , Rfl:    Cholecalciferol (VITAMIN D3) 50000 units TABS, Take 1 tablet by mouth daily., Disp: , Rfl:    cyclobenzaprine (FLEXERIL) 10 MG tablet, Take 1 tablet (10 mg total) by mouth at bedtime as needed., Disp: 30 tablet, Rfl: 2   ferrous sulfate (SLOW RELEASE IRON) 160 (50 Fe) MG TBCR SR tablet, Take 160 mg by mouth daily., Disp: , Rfl:    lisinopril (ZESTRIL) 20 MG tablet, Take 1 tablet (20 mg total)  by mouth daily., Disp: 90 tablet, Rfl: 3   meloxicam (MOBIC) 15 MG tablet, TAKE 1 TABLET BY MOUTH DAILY., Disp: 30 tablet, Rfl: 0   methocarbamol (ROBAXIN) 500 MG tablet, Take 1 tablet (500 mg total) by mouth every 6 (six) hours as needed for muscle spasms., Disp: 30 tablet, Rfl: 2   omeprazole (PRILOSEC) 20 MG capsule, Take 20 mg by mouth daily., Disp: , Rfl:    Review of Systems:   ROS Negative unless otherwise specified per HPI.  Vitals:   Vitals:   02/21/21 1402  BP: 130/74  Pulse: 85  Temp: 98.2 F (36.8 C)  TempSrc: Temporal  SpO2: 98%  Weight: 201 lb (91.2 kg)  Height: 5\' 3"  (1.6 m)     Body mass index is 35.61 kg/m.  Physical Exam:   Physical Exam Vitals and nursing note reviewed.  Constitutional:      General: She is not  in acute distress.    Appearance: She is well-developed. She is not ill-appearing or toxic-appearing.  Cardiovascular:     Rate and Rhythm: Normal rate and regular rhythm. Extrasystoles are present.    Pulses: Normal pulses.     Heart sounds: Normal heart sounds, S1 normal and S2 normal.  Pulmonary:     Effort: Pulmonary effort is normal.     Breath sounds: Normal breath sounds.  Skin:    General: Skin is warm and dry.  Neurological:     Mental Status: She is alert.     GCS: GCS eye subscore is 4. GCS verbal subscore is 5. GCS motor subscore is 6.  Psychiatric:        Speech: Speech normal.        Behavior: Behavior normal. Behavior is cooperative.    Assessment and Plan:   Palpitations EKG tracing is personally reviewed.  EKG notes NSR -- does have a PAC.  No acute changes noted. Update TSH, CBC and CMP Zio patch x 7 days ordered Cardiology consult ordered Follow-up if any new/worsening sx in the interim  I,Havlyn C Ratchford,acting as a scribe for [employer], PA.,have documented all relevant documentation on the behalf of Inda Coke, PA,as directed by  Inda Coke, PA while in the presence of Inda Coke, [place].  I, Inda Coke, [place], have reviewed all documentation for this visit. The documentation on 02/21/21 for the exam, diagnosis, procedures, and orders are all accurate and complete.   Inda Coke, PA-C

## 2021-02-22 LAB — CBC WITH DIFFERENTIAL/PLATELET
Basophils Absolute: 0.1 10*3/uL (ref 0.0–0.1)
Basophils Relative: 1.2 % (ref 0.0–3.0)
Eosinophils Absolute: 0.4 10*3/uL (ref 0.0–0.7)
Eosinophils Relative: 6.1 % — ABNORMAL HIGH (ref 0.0–5.0)
HCT: 41.1 % (ref 36.0–46.0)
Hemoglobin: 13.8 g/dL (ref 12.0–15.0)
Lymphocytes Relative: 37.2 % (ref 12.0–46.0)
Lymphs Abs: 2.2 10*3/uL (ref 0.7–4.0)
MCHC: 33.6 g/dL (ref 30.0–36.0)
MCV: 89.1 fl (ref 78.0–100.0)
Monocytes Absolute: 0.4 10*3/uL (ref 0.1–1.0)
Monocytes Relative: 7.5 % (ref 3.0–12.0)
Neutro Abs: 2.8 10*3/uL (ref 1.4–7.7)
Neutrophils Relative %: 48 % (ref 43.0–77.0)
Platelets: 288 10*3/uL (ref 150.0–400.0)
RBC: 4.62 Mil/uL (ref 3.87–5.11)
RDW: 13.3 % (ref 11.5–15.5)
WBC: 5.8 10*3/uL (ref 4.0–10.5)

## 2021-02-22 LAB — COMPREHENSIVE METABOLIC PANEL
ALT: 48 U/L — ABNORMAL HIGH (ref 0–35)
AST: 35 U/L (ref 0–37)
Albumin: 4.5 g/dL (ref 3.5–5.2)
Alkaline Phosphatase: 73 U/L (ref 39–117)
BUN: 9 mg/dL (ref 6–23)
CO2: 26 mEq/L (ref 19–32)
Calcium: 9.5 mg/dL (ref 8.4–10.5)
Chloride: 104 mEq/L (ref 96–112)
Creatinine, Ser: 0.66 mg/dL (ref 0.40–1.20)
GFR: 99.84 mL/min (ref >60.00)
Glucose, Bld: 73 mg/dL (ref 70–99)
Potassium: 3.8 mEq/L (ref 3.5–5.1)
Sodium: 140 mEq/L (ref 135–145)
Total Bilirubin: 0.3 mg/dL (ref 0.2–1.2)
Total Protein: 7.1 g/dL (ref 6.0–8.3)

## 2021-02-22 LAB — TSH: TSH: 2.86 u[IU]/mL (ref 0.35–5.50)

## 2021-02-23 DIAGNOSIS — R002 Palpitations: Secondary | ICD-10-CM | POA: Diagnosis not present

## 2021-03-10 ENCOUNTER — Other Ambulatory Visit (HOSPITAL_COMMUNITY): Payer: Self-pay

## 2021-03-10 DIAGNOSIS — R002 Palpitations: Secondary | ICD-10-CM | POA: Diagnosis not present

## 2021-03-22 ENCOUNTER — Other Ambulatory Visit (HOSPITAL_COMMUNITY): Payer: Self-pay

## 2021-03-22 ENCOUNTER — Encounter: Payer: Self-pay | Admitting: Cardiology

## 2021-03-22 ENCOUNTER — Ambulatory Visit (INDEPENDENT_AMBULATORY_CARE_PROVIDER_SITE_OTHER): Payer: 59 | Admitting: Cardiology

## 2021-03-22 ENCOUNTER — Other Ambulatory Visit: Payer: Self-pay

## 2021-03-22 VITALS — BP 124/78 | HR 80 | Ht 63.0 in | Wt 194.0 lb

## 2021-03-22 DIAGNOSIS — I471 Supraventricular tachycardia: Secondary | ICD-10-CM | POA: Diagnosis not present

## 2021-03-22 DIAGNOSIS — E785 Hyperlipidemia, unspecified: Secondary | ICD-10-CM | POA: Diagnosis not present

## 2021-03-22 DIAGNOSIS — R011 Cardiac murmur, unspecified: Secondary | ICD-10-CM | POA: Diagnosis not present

## 2021-03-22 DIAGNOSIS — I2510 Atherosclerotic heart disease of native coronary artery without angina pectoris: Secondary | ICD-10-CM

## 2021-03-22 DIAGNOSIS — I10 Essential (primary) hypertension: Secondary | ICD-10-CM

## 2021-03-22 DIAGNOSIS — E669 Obesity, unspecified: Secondary | ICD-10-CM

## 2021-03-22 DIAGNOSIS — R7303 Prediabetes: Secondary | ICD-10-CM

## 2021-03-22 MED ORDER — CARVEDILOL 3.125 MG PO TABS
3.1250 mg | ORAL_TABLET | Freq: Two times a day (BID) | ORAL | 3 refills | Status: DC
  Filled 2021-03-22: qty 180, 90d supply, fill #0
  Filled 2021-06-07: qty 180, 90d supply, fill #1
  Filled 2021-10-09: qty 180, 90d supply, fill #2
  Filled 2022-01-15: qty 180, 90d supply, fill #3

## 2021-03-22 NOTE — Patient Instructions (Addendum)
Medication Instructions:  Your physician has recommended you make the following change in your medication:  STOP: Lisinopril START: Coreg 3.125 mg twice daily *If you need a refill on your cardiac medications before your next appointment, please call your pharmacy*   Lab Work: Your physician recommends that you return for lab work in:  [place]: HbgA1C, Lp(a)  If you have labs (blood work) drawn today and your tests are completely normal, you will receive your results only by: MyChart Message (if you have MyChart) OR A paper copy in the mail If you have any lab test that is abnormal or we need to change your treatment, we will call you to review the results.   Testing/Procedures: Your physician has requested that you have an echocardiogram. Echocardiography is a painless test that uses sound waves to create images of your heart. It provides your doctor with information about the size and shape of your heart and how well your hearts chambers and valves are working. This procedure takes approximately one hour. There are no restrictions for this procedure.   Dr. Harriet Masson has ordered a CT coronary calcium score. This test is done at [address]. This is $99 out of pocket.  Coronary CalciumScan A coronary calcium scan is an imaging test used to look for deposits of calcium and other fatty materials (plaques) in the inner lining of the blood vessels of the heart (coronary arteries). These deposits of calcium and plaques can partly clog and narrow the coronary arteries without producing any symptoms or warning signs. This puts a person at risk for a heart attack. This test can detect these deposits before symptoms develop. Tell a health care provider about: Any allergies you have. All medicines you are taking, including vitamins, herbs, eye drops, creams, and over-the-counter medicines. Any problems you or family members have had with anesthetic medicines. Any blood disorders  you have. Any surgeries you have had. Any medical conditions you have. Whether you are pregnant or may be pregnant. What are the risks? Generally, this is a safe procedure. However, problems may occur, including: Harm to a pregnant woman and her unborn baby. This test involves the use of radiation. Radiation exposure can be dangerous to a pregnant woman and her unborn baby. If you are pregnant, you generally should not have this procedure done. Slight increase in the risk of cancer. This is because of the radiation involved in the test. What happens before the procedure? No preparation is needed for this procedure. What happens during the procedure? You will undress and remove any jewelry around your neck or chest. You will put on a hospital gown. Sticky electrodes will be placed on your chest. The electrodes will be connected to an electrocardiogram (ECG) machine to record a tracing of the electrical activity of your heart. A CT scanner will take pictures of your heart. During this time, you will be asked to lie still and hold your breath for 2-3 seconds while a picture of your heart is being taken. The procedure may vary among health care providers and hospitals. What happens after the procedure? You can get dressed. You can return to your normal activities. It is up to you to get the results of your test. Ask your health care provider, or the department that is doing the test, when your results will be ready. Summary A coronary calcium scan is an imaging test used to look for deposits of calcium and other fatty materials (plaques) in the inner  lining of the blood vessels of the heart (coronary arteries). Generally, this is a safe procedure. Tell your health care provider if you are pregnant or may be pregnant. No preparation is needed for this procedure. A CT scanner will take pictures of your heart. You can return to your normal activities after the scan is done. This information is not  intended to replace advice given to you by your health care provider. Make sure you discuss any questions you have with your health care provider. Document Released: 09/22/2007 Document Revised: 02/13/2016 Document Reviewed: 02/13/2016 Elsevier Interactive Patient Education  2017 [place]: At [facility], you and your health needs are our priority.  As part of our continuing mission to provide you with exceptional heart care, we have created designated Provider Care Teams.  These Care Teams include your primary Cardiologist (physician) and Advanced Practice Providers (APPs -  Physician Assistants and Nurse Practitioners) who all work together to provide you with the care you need, when you need it.  We recommend signing up for the patient portal called "MyChart".  Sign up information is provided on this After Visit Summary.  MyChart is used to connect with patients for Virtual Visits (Telemedicine).  Patients are able to view lab/test results, encounter notes, upcoming appointments, etc.  Non-urgent messages can be sent to your provider as well.   To learn more about what you can do with MyChart, go to [email or web address].    Your next appointment:   1 year(s)  The format for your next appointment:   In Person  Provider:   Berniece Salines, DO   Other Instructions

## 2021-03-22 NOTE — Progress Notes (Signed)
Cardiology Office Note:    Date:  03/23/2021   ID:  Nicole Rosario, DOB 28-Sep-1965, MRN [id number]  PCP:  Leamon Arnt, MD  Cardiologist:  Berniece Salines, DO  Electrophysiologist:  None   Referring MD: Inda Coke, PA   Chief Complaint  Patient presents with   New Patient (Initial Visit)   Palpitations    History of Present Illness:    Nicole Rosario is a 55 y.o. female with a hx of hyperlipidemia, obesity, hypertension presented to be evaluated for palpitations.  The patient tells me that she has been experiencing intermittent palpitations.  She was wore a ZIO monitor recently which showed rare evidence of paroxysmal SVT.    She tells me she has been experiencing more palpitations.  She denies any chest pain or shortness of breath.  Past Medical History:  Diagnosis Date   Cholelithiasis 04/22/2019   Colon cancer Stage III at splenic flexure; followed at [facility]; s/p surgery and chemo. 04/09/2012   Fatty liver 09/18/2017   GERD (gastroesophageal reflux disease)    Hyperlipidemia 10/08/2011   Obesity (BMI 30-39.9) 09/18/2017   Positive TB test    Seasonal allergies 10/08/2011   Vasomotor symptoms due to menopause 09/18/2017    Past Surgical History:  Procedure Laterality Date   COLON SURGERY     HEMICOLECTOMY  2013   LUMBAR DISC SURGERY     MICRODISCECTOMY LUMBAR  2019    Current Medications: Current Meds  Medication Sig   aspirin EC 81 MG tablet Take 1 tablet by mouth daily.   carvedilol (COREG) 3.125 MG tablet Take 1 tablet (3.125 mg total) by mouth 2 (two) times daily.   Cetirizine HCl 10 MG CAPS Take 10 mg by mouth daily.   Cholecalciferol (VITAMIN D3) 125 MCG (5000 UT) TABS Take 5,000 Units by mouth daily.   cyclobenzaprine (FLEXERIL) 10 MG tablet Take 1 tablet (10 mg total) by mouth at bedtime as needed.   ferrous sulfate (SLOW RELEASE IRON) 160 (50 Fe) MG TBCR SR tablet Take 160 mg by mouth daily.   methocarbamol (ROBAXIN) 500 MG tablet Take 1 tablet (500 mg total) by  mouth every 6 (six) hours as needed for muscle spasms.   [DISCONTINUED] Cholecalciferol (VITAMIN D3) 50000 units TABS Take 1 tablet by mouth daily.   [DISCONTINUED] lisinopril (ZESTRIL) 20 MG tablet Take 1 tablet (20 mg total) by mouth daily.     Allergies:   Erythromycin   Social History   Socioeconomic History   Marital status: Married    Spouse name: Vania Rea   Number of children: 0   Years of education: Not on file   Highest education level: Associate degree: occupational, Hotel manager, or vocational program  Occupational History   Occupation: Programmer, multimedia: [employer]    Comment: [employer]  Tobacco Use   Smoking status: Former   Smokeless tobacco: Never  Scientific laboratory technician Use: Never used  Substance and Sexual Activity   Alcohol use: Yes    Alcohol/week: 0.0 standard drinks    Comment: social   Drug use: Never   Sexual activity: Yes    Birth control/protection: Post-menopausal  Other Topics Concern   Not on file  Social History Narrative   Not on file   Social Determinants of Health   Financial Resource Strain: Not on file  Food Insecurity: Not on file  Transportation Needs: Not on file  Physical Activity: Not on file  Stress: Not on file  Social Connections:  Not on file     Family History: The patient's family history includes Atrial fibrillation in her father and mother; Cancer in her paternal grandmother; Colon cancer in her maternal grandmother; Heart disease in her maternal aunt, maternal grandfather, and mother; Hypertension in her father; Lupus in her sister; Other in her mother; Parkinson's disease in her mother; Sick sinus syndrome in her mother.  ROS:   Review of Systems  Constitution: Negative for decreased appetite, fever and weight gain.  HENT: Negative for congestion, ear discharge, hoarse voice and sore throat.   Eyes: Negative for discharge, redness, vision loss in right eye and visual halos.  Cardiovascular: Report palpitations.  Negative for  chest pain, dyspnea on exertion, leg swelling, orthopnea. Respiratory: Negative for cough, hemoptysis, shortness of breath and snoring.   Endocrine: Negative for heat intolerance and polyphagia.  Hematologic/Lymphatic: Negative for bleeding problem. Does not bruise/bleed easily.  Skin: Negative for flushing, nail changes, rash and suspicious lesions.  Musculoskeletal: Negative for arthritis, joint pain, muscle cramps, myalgias, neck pain and stiffness.  Gastrointestinal: Negative for abdominal pain, bowel incontinence, diarrhea and excessive appetite.  Genitourinary: Negative for decreased libido, genital sores and incomplete emptying.  Neurological: Negative for brief paralysis, focal weakness, headaches and loss of balance.  Psychiatric/Behavioral: Negative for altered mental status, depression and suicidal ideas.  Allergic/Immunologic: Negative for HIV exposure and persistent infections.    EKGs/Labs/Other Studies Reviewed:    The following studies were reviewed today:   EKG:  The ekg ordered today demonstrates sinus rhythm, heart rate 80 bpm with poor R wave progression cannot rule out old anterior wall infarction with left ventricular atrophy.  ZIO monitor Patch Wear Time:  7 days and 0 hours (2022-11-17T08:57:11-498 to 2022-11-24T09:33:21-0500)   Sinus rhythm (Minimum HR of 49 bpm, max HR of 182 bpm, and avg HR of 80 bpm).   3 Supraventricular Tachycardia runs occurred, the run with the fastest interval lasting 4 beats with a max rate of 182 bpm, the longest lasting 9 beats with an avg rate of 134 bpm. Supraventricular Tachycardia was detected within +/- 45 seconds of symptomatic patient event(s). Isolated PVCs (<1.0%, 1632).   Recent Labs: 02/21/2021: ALT 48; BUN 9; Creatinine, Ser 0.66; Hemoglobin 13.8; Platelets 288.0; Potassium 3.8; Sodium 140; TSH 2.86  Recent Lipid Panel    Component Value Date/Time   CHOL 209 (H) 12/14/2020 1040   TRIG 251.0 (H) 12/14/2020 1040   HDL  43.00 12/14/2020 1040   CHOLHDL 5 12/14/2020 1040   VLDL 50.2 (H) 12/14/2020 1040   LDLCALC 161 (H) 11/23/2019 0958   LDLDIRECT 125.0 12/14/2020 1040    Physical Exam:    VS:  BP 124/78 (BP Location: Right Arm, Patient Position: Sitting, Cuff Size: Normal)    Pulse 80    Ht 5\' 3"  (1.6 m)    Wt 194 lb (88 kg)    BMI 34.37 kg/m     Wt Readings from Last 3 Encounters:  03/22/21 194 lb (88 kg)  02/21/21 201 lb (91.2 kg)  12/14/20 195 lb (88.5 kg)     GEN: Well nourished, well developed in no acute distress HEENT: Normal NECK: No JVD; No carotid bruits LYMPHATICS: No lymphadenopathy CARDIAC: S1S2 noted,RRR, no murmurs, rubs, gallops RESPIRATORY:  Clear to auscultation without rales, wheezing or rhonchi  ABDOMEN: Soft, non-tender, non-distended, +bowel sounds, no guarding. EXTREMITIES: No edema, No cyanosis, no clubbing MUSCULOSKELETAL:  No deformity  SKIN: Warm and dry NEUROLOGIC:  Alert and oriented x 3, non-focal PSYCHIATRIC:  Normal  affect, good insight  ASSESSMENT:    1. ASCVD (arteriosclerotic cardiovascular disease)   2. Hypertension, unspecified type   3. Prediabetes   4. PSVT (paroxysmal supraventricular tachycardia) ([place])   5. Hyperlipidemia, unspecified hyperlipidemia type   6. Murmur, cardiac    PLAN:    We talked about her monitor results which shows some evidence of rare paroxysmal SVT.  She has had this issue for many years.  I am going to start the patient on carvedilol 3.125 mg twice daily this will help with her heart rate as well as her blood pressure.  She has been placed on lisinopril in the past but recently she had not been taking this medication.  Educated the patient that if her blood pressure dropped to feel lightheaded to notify me immediately at which time we will consider transitioning from Coreg to Toprol-XL pulpit 5 mg daily.  Review her lipid profile which was done recently by her PCP I am also going to get LP(a) done for the patient.  As well as  she has prediabetes this testing was done back in 2020 we will repeat her hemoglobin A1c.  Giving all of her risk factors we discussed a calcium score for this patient, he did have some abnormal changes on her EKG but at this time is not symptomatic.  Should she have a heavy burden of calcium will consider further ischemic evaluation.  There is also LVH we will get an echocardiogram to assess to make sure that there is no significant endorgan damage only hypertension in addition there is evidence of midsystolic cardiac murmur on the echo.  The patient understands the need to lose weight with diet and exercise. We have discussed specific strategies for this.   The patient is in agreement with the above plan. The patient left the office in stable condition.  The patient will follow up in   Medication Adjustments/Labs and Tests Ordered: Current medicines are reviewed at length with the patient today.  Concerns regarding medicines are outlined above.  Orders Placed This Encounter  Procedures   CT CARDIAC SCORING (SELF PAY ONLY)   Lipoprotein A (LPA)   Hemoglobin A1c   EKG 12-Lead   ECHOCARDIOGRAM COMPLETE   Meds ordered this encounter  Medications   carvedilol (COREG) 3.125 MG tablet    Sig: Take 1 tablet (3.125 mg total) by mouth 2 (two) times daily.    Dispense:  180 tablet    Refill:  3    Patient Instructions  Medication Instructions:  Your physician has recommended you make the following change in your medication:  STOP: Lisinopril START: Coreg 3.125 mg twice daily *If you need a refill on your cardiac medications before your next appointment, please call your pharmacy*   Lab Work: Your physician recommends that you return for lab work in:  [place]: HbgA1C, Lp(a)  If you have labs (blood work) drawn today and your tests are completely normal, you will receive your results only by: MyChart Message (if you have MyChart) OR A paper copy in the mail If you have any lab test  that is abnormal or we need to change your treatment, we will call you to review the results.   Testing/Procedures: Your physician has requested that you have an echocardiogram. Echocardiography is a painless test that uses sound waves to create images of your heart. It provides your doctor with information about the size and shape of your heart and how well your hearts chambers and valves are working. This  procedure takes approximately one hour. There are no restrictions for this procedure.   Dr. Harriet Masson has ordered a CT coronary calcium score. This test is done at [address]. This is $99 out of pocket.  Coronary CalciumScan A coronary calcium scan is an imaging test used to look for deposits of calcium and other fatty materials (plaques) in the inner lining of the blood vessels of the heart (coronary arteries). These deposits of calcium and plaques can partly clog and narrow the coronary arteries without producing any symptoms or warning signs. This puts a person at risk for a heart attack. This test can detect these deposits before symptoms develop. Tell a health care provider about: Any allergies you have. All medicines you are taking, including vitamins, herbs, eye drops, creams, and over-the-counter medicines. Any problems you or family members have had with anesthetic medicines. Any blood disorders you have. Any surgeries you have had. Any medical conditions you have. Whether you are pregnant or may be pregnant. What are the risks? Generally, this is a safe procedure. However, problems may occur, including: Harm to a pregnant woman and her unborn baby. This test involves the use of radiation. Radiation exposure can be dangerous to a pregnant woman and her unborn baby. If you are pregnant, you generally should not have this procedure done. Slight increase in the risk of cancer. This is because of the radiation involved in the test. What happens before the  procedure? No preparation is needed for this procedure. What happens during the procedure? You will undress and remove any jewelry around your neck or chest. You will put on a hospital gown. Sticky electrodes will be placed on your chest. The electrodes will be connected to an electrocardiogram (ECG) machine to record a tracing of the electrical activity of your heart. A CT scanner will take pictures of your heart. During this time, you will be asked to lie still and hold your breath for 2-3 seconds while a picture of your heart is being taken. The procedure may vary among health care providers and hospitals. What happens after the procedure? You can get dressed. You can return to your normal activities. It is up to you to get the results of your test. Ask your health care provider, or the department that is doing the test, when your results will be ready. Summary A coronary calcium scan is an imaging test used to look for deposits of calcium and other fatty materials (plaques) in the inner lining of the blood vessels of the heart (coronary arteries). Generally, this is a safe procedure. Tell your health care provider if you are pregnant or may be pregnant. No preparation is needed for this procedure. A CT scanner will take pictures of your heart. You can return to your normal activities after the scan is done. This information is not intended to replace advice given to you by your health care provider. Make sure you discuss any questions you have with your health care provider. Document Released: 09/22/2007 Document Revised: 02/13/2016 Document Reviewed: 02/13/2016 Elsevier Interactive Patient Education  2017 [place]: At [facility], you and your health needs are our priority.  As part of our continuing mission to provide you with exceptional heart care, we have created designated Provider Care Teams.  These Care Teams include your primary Cardiologist (physician) and  Advanced Practice Providers (APPs -  Physician Assistants and Nurse Practitioners) who all work together to provide you with the care  you need, when you need it.  We recommend signing up for the patient portal called "MyChart".  Sign up information is provided on this After Visit Summary.  MyChart is used to connect with patients for Virtual Visits (Telemedicine).  Patients are able to view lab/test results, encounter notes, upcoming appointments, etc.  Non-urgent messages can be sent to your provider as well.   To learn more about what you can do with MyChart, go to [email or web address].    Your next appointment:   1 year(s)  The format for your next appointment:   In Person  Provider:   Berniece Salines, DO   Other Instructions     Adopting a Healthy Lifestyle.  Know what a healthy weight is for you (roughly BMI <25) and aim to maintain this   Aim for 7+ servings of fruits and vegetables daily   65-80+ fluid ounces of water or unsweet tea for healthy kidneys   Limit to max 1 drink of alcohol per day; avoid smoking/tobacco   Limit animal fats in diet for cholesterol and heart health - choose grass fed whenever available   Avoid highly processed foods, and foods high in saturated/trans fats   Aim for low stress - take time to unwind and care for your mental health   Aim for 150 min of moderate intensity exercise weekly for heart health, and weights twice weekly for bone health   Aim for 7-9 hours of sleep daily   When it comes to diets, agreement about the perfect plan isnt easy to find, even among the experts. Experts at the [place] developed an idea known as the Healthy Eating Plate. Just imagine a plate divided into logical, healthy portions.   The emphasis is on diet quality:   Load up on vegetables and fruits - one-half of your plate: Aim for color and variety, and remember that potatoes dont count.   Go for whole grains - one-quarter of your  plate: Whole wheat, barley, wheat berries, quinoa, oats, brown rice, and foods made with them. If you want pasta, go with whole wheat pasta.   Protein power - one-quarter of your plate: Fish, chicken, beans, and nuts are all healthy, versatile protein sources. Limit red meat.   The diet, however, does go beyond the plate, offering a few other suggestions.   Use healthy plant oils, such as olive, canola, soy, corn, sunflower and peanut. Check the labels, and avoid partially hydrogenated oil, which have unhealthy trans fats.   If youre thirsty, drink water. Coffee and tea are good in moderation, but skip sugary drinks and limit milk and dairy products to one or two daily servings.   The type of carbohydrate in the diet is more important than the amount. Some sources of carbohydrates, such as vegetables, fruits, whole grains, and beans-are healthier than others.   Finally, stay active  Signed, Berniece Salines, DO  03/23/2021 9:06 AM    [place]

## 2021-03-23 DIAGNOSIS — I471 Supraventricular tachycardia, unspecified: Secondary | ICD-10-CM | POA: Insufficient documentation

## 2021-03-23 DIAGNOSIS — E785 Hyperlipidemia, unspecified: Secondary | ICD-10-CM | POA: Insufficient documentation

## 2021-03-23 DIAGNOSIS — R011 Cardiac murmur, unspecified: Secondary | ICD-10-CM | POA: Insufficient documentation

## 2021-03-23 DIAGNOSIS — R7303 Prediabetes: Secondary | ICD-10-CM | POA: Insufficient documentation

## 2021-03-23 DIAGNOSIS — I2510 Atherosclerotic heart disease of native coronary artery without angina pectoris: Secondary | ICD-10-CM | POA: Insufficient documentation

## 2021-03-23 DIAGNOSIS — I10 Essential (primary) hypertension: Secondary | ICD-10-CM | POA: Insufficient documentation

## 2021-03-25 ENCOUNTER — Encounter: Payer: Self-pay | Admitting: Family Medicine

## 2021-03-27 ENCOUNTER — Other Ambulatory Visit: Payer: Self-pay

## 2021-03-27 DIAGNOSIS — Z8379 Family history of other diseases of the digestive system: Secondary | ICD-10-CM

## 2021-03-27 DIAGNOSIS — R7303 Prediabetes: Secondary | ICD-10-CM | POA: Diagnosis not present

## 2021-03-27 DIAGNOSIS — E785 Hyperlipidemia, unspecified: Secondary | ICD-10-CM | POA: Diagnosis not present

## 2021-03-28 LAB — HEMOGLOBIN A1C
Est. average glucose Bld gHb Est-mCnc: 114 mg/dL
Hgb A1c MFr Bld: 5.6 % (ref 4.8–5.6)

## 2021-03-28 LAB — LIPOPROTEIN A (LPA): Lipoprotein (a): 63.3 nmol/L (ref <75.0)

## 2021-03-28 NOTE — Telephone Encounter (Signed)
Labs have been ordered, patient is scheduled

## 2021-04-05 ENCOUNTER — Other Ambulatory Visit: Payer: 59

## 2021-04-05 ENCOUNTER — Other Ambulatory Visit: Payer: Self-pay

## 2021-04-05 DIAGNOSIS — Z8379 Family history of other diseases of the digestive system: Secondary | ICD-10-CM

## 2021-04-07 LAB — MITOCHONDRIAL ANTIBODIES: Mitochondrial M2 Ab, IgG: <=20 U (ref <=20.0)

## 2021-04-12 ENCOUNTER — Other Ambulatory Visit: Payer: Self-pay

## 2021-04-12 ENCOUNTER — Ambulatory Visit (INDEPENDENT_AMBULATORY_CARE_PROVIDER_SITE_OTHER)
Admission: RE | Admit: 2021-04-12 | Discharge: 2021-04-12 | Disposition: A | Discharge status: Home / Self Care | Payer: Self-pay | Source: Ambulatory Visit | Attending: Cardiology | Admitting: Cardiology

## 2021-04-12 ENCOUNTER — Ambulatory Visit (HOSPITAL_COMMUNITY): Payer: 59 | Attending: Cardiology

## 2021-04-12 DIAGNOSIS — R011 Cardiac murmur, unspecified: Secondary | ICD-10-CM | POA: Diagnosis not present

## 2021-04-12 DIAGNOSIS — E785 Hyperlipidemia, unspecified: Secondary | ICD-10-CM

## 2021-04-12 DIAGNOSIS — I2510 Atherosclerotic heart disease of native coronary artery without angina pectoris: Secondary | ICD-10-CM

## 2021-04-12 LAB — ECHOCARDIOGRAM COMPLETE
Area-P 1/2: 3.21 cm2
S' Lateral: 2.8 cm

## 2021-06-08 ENCOUNTER — Other Ambulatory Visit (HOSPITAL_COMMUNITY): Payer: Self-pay

## 2021-06-12 DIAGNOSIS — C185 Malignant neoplasm of splenic flexure: Secondary | ICD-10-CM | POA: Diagnosis not present

## 2021-06-12 DIAGNOSIS — C189 Malignant neoplasm of colon, unspecified: Secondary | ICD-10-CM | POA: Diagnosis not present

## 2021-06-15 ENCOUNTER — Ambulatory Visit: Payer: 59 | Admitting: Family Medicine

## 2021-07-03 ENCOUNTER — Other Ambulatory Visit (HOSPITAL_COMMUNITY): Payer: Self-pay

## 2021-07-03 ENCOUNTER — Ambulatory Visit: Payer: 59 | Admitting: Family Medicine

## 2021-07-03 ENCOUNTER — Encounter: Payer: Self-pay | Admitting: Family Medicine

## 2021-07-03 VITALS — BP 112/70 | HR 115 | Temp 98.1°F | Ht 63.0 in | Wt 196.6 lb

## 2021-07-03 DIAGNOSIS — I10 Essential (primary) hypertension: Secondary | ICD-10-CM | POA: Diagnosis not present

## 2021-07-03 DIAGNOSIS — F4321 Adjustment disorder with depressed mood: Secondary | ICD-10-CM

## 2021-07-03 DIAGNOSIS — E782 Mixed hyperlipidemia: Secondary | ICD-10-CM

## 2021-07-03 DIAGNOSIS — I2510 Atherosclerotic heart disease of native coronary artery without angina pectoris: Secondary | ICD-10-CM

## 2021-07-03 DIAGNOSIS — I471 Supraventricular tachycardia: Secondary | ICD-10-CM

## 2021-07-03 MED ORDER — ALPRAZOLAM 0.5 MG PO TABS
0.5000 mg | ORAL_TABLET | Freq: Every day | ORAL | 0 refills | Status: DC | PRN
  Filled 2021-07-03: qty 30, 30d supply, fill #0

## 2021-07-03 NOTE — Progress Notes (Signed)
? ?Subjective  ?CC:  ?Chief Complaint  ?Patient presents with  ? Hypertension  ?  Pt has been keeping a track of her Bp readings as best she could  ? ? ?HPI: Nicole Rosario is a 56 y.o. female who presents to the office today to address the problems listed above in the chief complaint. ?Hypertension and palpitations f/u: Control is good . Pt reports she is doing well. taking medications as instructed, no medication side effects noted, no TIAs, no chest pain on exertion, no dyspnea on exertion, no swelling of ankles. I reviewed cardiology evaluation for palpitaitions: nl echo, CAC score 7.2, nl lipoprotein a; started on bid carvedilol which has helped. Still with some mild sxs. She denies adverse effects from his BP medications. Compliance with medication is good.  ?Grief ; lost both parents in the last 2 years and dog passed 2 weeks ago. Still very sad overall. Can't sleep recently since passing of dog. Stressed and anxious about dealing with parents estate. No mood problems overall. Tired. ? ?Assessment  ?1. Essential hypertension   ?2. ASCVD (arteriosclerotic cardiovascular disease)   ?3. Mixed hyperlipidemia   ?4. Complicated grief   ?5. PSVT (paroxysmal supraventricular tachycardia) ([place])   ? ?  ?Plan  ? ?Hypertension f/u: BP control is well controlled. Continue bb.  ?PSVT: stable on coreg. Can increase dose if needed. Defer for now. Bp is good. HR is 70s/80s ?Hyperlipidemia f/u: no indication for statin yet. Low CAC score.  ?Complicated grief. Counseling done. Xanax prn for sleep and anxiety and monitor mood. F/u here if worsens.  ? ?Education regarding management of these chronic disease states was given. Management strategies discussed on successive visits include dietary and exercise recommendations, goals of achieving and maintaining IBW, and lifestyle modifications aiming for adequate sleep and minimizing stressors.  ? ?Follow up: sept cpe ? ?No orders of the defined types were placed in this  encounter. ? ?Meds ordered this encounter  ?Medications  ? ALPRAZolam (XANAX) 0.5 MG tablet  ?  Sig: Take 1 tablet (0.5 mg total) by mouth daily as needed for anxiety.  ?  Dispense:  30 tablet  ?  Refill:  0  ? ?  ? ?BP Readings from Last 3 Encounters:  ?07/03/21 112/70  ?03/22/21 124/78  ?02/21/21 130/74  ? ?Wt Readings from Last 3 Encounters:  ?07/03/21 196 lb 9.6 oz (89.2 kg)  ?03/22/21 194 lb (88 kg)  ?02/21/21 201 lb (91.2 kg)  ? ? ?Lab Results  ?Component Value Date  ? CHOL 209 (H) 12/14/2020  ? CHOL 252 (H) 11/23/2019  ? CHOL 183 10/22/2018  ? ?Lab Results  ?Component Value Date  ? HDL 43.00 12/14/2020  ? HDL 50 11/23/2019  ? HDL 46.40 10/22/2018  ? ?Lab Results  ?Component Value Date  ? LDLCALC 161 (H) 11/23/2019  ? [place] 111 04/12/2017  ? ?Lab Results  ?Component Value Date  ? TRIG 251.0 (H) 12/14/2020  ? TRIG 248 (H) 11/23/2019  ? TRIG 272.0 (H) 10/22/2018  ? ?Lab Results  ?Component Value Date  ? CHOLHDL 5 12/14/2020  ? CHOLHDL 5.0 (H) 11/23/2019  ? CHOLHDL 4 10/22/2018  ? ?Lab Results  ?Component Value Date  ? LDLDIRECT 125.0 12/14/2020  ? LDLDIRECT 107.0 10/22/2018  ? LDLDIRECT 132.0 03/26/2018  ? ?Lab Results  ?Component Value Date  ? CREATININE 0.66 02/21/2021  ? BUN 9 02/21/2021  ? NA 140 02/21/2021  ? K 3.8 02/21/2021  ? CL 104 02/21/2021  ? CO2 26  02/21/2021  ? ? ?The 10-year ASCVD risk score (Arnett DK, et al., 2019) is: 2.5% ?  Values used to calculate the score: ?    Age: 17 years ?    Sex: Female ?    Is Non-Hispanic African American: No ?    Diabetic: No ?    Tobacco smoker: No ?    Systolic Blood Pressure: 505 mmHg ?    Is BP treated: Yes ?    HDL Cholesterol: 43 mg/dL ?    Total Cholesterol: 209 mg/dL ? ?I reviewed the patients updated PMH, FH, and SocHx.  ?  ?Patient Active Problem List  ? Diagnosis Date Noted  ? PSVT (paroxysmal supraventricular tachycardia) ([place]) 03/23/2021  ?  Priority: High  ? Essential hypertension 06/23/2020  ?  Priority: High  ? Family history of premature CAD  04/22/2019  ?  Priority: High  ? Obesity (BMI 30-39.9) 09/18/2017  ?  Priority: High  ? H/O colon cancer, stage III 09/05/2014  ?  Priority: High  ? Hepatic steatosis mild by ultrasound 09/18/2017  ?  Priority: Medium   ? Hx of left hemicolectomy 09/18/2017  ?  Priority: Medium   ? Hx of microdiscectomy, L5-S1 09/18/2017  ?  Priority: Medium   ? GERD (gastroesophageal reflux disease) 07/03/2012  ?  Priority: Medium   ? Mixed hyperlipidemia 10/08/2011  ?  Priority: Medium   ? Cholelithiasis 04/22/2019  ?  Priority: Low  ? Vitamin B12 deficiency 09/18/2017  ?  Priority: Low  ? Seasonal allergies 10/08/2011  ?  Priority: Low  ? ASCVD (arteriosclerotic cardiovascular disease) 03/23/2021  ? ? ?Allergies: Erythromycin ? ?Social History: ?Patient  reports that she has quit smoking. She has never used smokeless tobacco. She reports current alcohol use. She reports that she does not use drugs. ? ?Current Meds  ?Medication Sig  ? ALPRAZolam (XANAX) 0.5 MG tablet Take 1 tablet (0.5 mg total) by mouth daily as needed for anxiety.  ? aspirin EC 81 MG tablet Take 1 tablet by mouth daily.  ? carvedilol (COREG) 3.125 MG tablet Take 1 tablet (3.125 mg total) by mouth 2 (two) times daily.  ? Cholecalciferol (VITAMIN D3) 125 MCG (5000 UT) TABS Take 5,000 Units by mouth daily.  ? cyclobenzaprine (FLEXERIL) 10 MG tablet Take 1 tablet (10 mg total) by mouth at bedtime as needed.  ? ferrous sulfate (SLOW RELEASE IRON) 160 (50 Fe) MG TBCR SR tablet Take 160 mg by mouth daily.  ? methocarbamol (ROBAXIN) 500 MG tablet Take 1 tablet (500 mg total) by mouth every 6 (six) hours as needed for muscle spasms.  ? ? ?Review of Systems: ?Cardiovascular: negative for chest pain, palpitations, leg swelling, orthopnea ?Respiratory: negative for SOB, wheezing or persistent cough ?Gastrointestinal: negative for abdominal pain ?Genitourinary: negative for dysuria or gross hematuria ? ?Objective  ?Vitals: BP 112/70   Pulse (!) 115   Temp 98.1 ?F (36.7 ?C)    Ht '5\' 3"'$  (1.6 m)   Wt 196 lb 9.6 oz (89.2 kg)   SpO2 100%   BMI 34.83 kg/m?  ?General: no acute distress  ?Psych:  Alert and oriented, normal mood and affect, tearful at times ?HEENT:  Normocephalic, atraumatic, supple neck  ?Neurologic:   Mental status is normal ?Commons side effects, risks, benefits, and alternatives for medications and treatment plan prescribed today were discussed, and the patient expressed understanding of the given instructions. Patient is instructed to call or message via MyChart if he/she has any questions or concerns  regarding our treatment plan. No barriers to understanding were identified. We discussed Red Flag symptoms and signs in detail. Patient expressed understanding regarding what to do in case of urgent or emergency type symptoms.  ?Medication list was reconciled, printed and provided to the patient in AVS. Patient instructions and summary information was reviewed with the patient as documented in the AVS. ?This note was prepared with assistance of Systems analyst. Occasional wrong-word or sound-a-like substitutions may have occurred due to the inherent limitations of voice recognition software ? ?This visit occurred during the SARS-CoV-2 public health emergency.  Safety protocols were in place, including screening questions prior to the visit, additional usage of staff PPE, and extensive cleaning of exam room while observing appropriate contact time as indicated for disinfecting solutions.  ?

## 2021-07-03 NOTE — Patient Instructions (Signed)
Please return in September for your annual complete physical; please come fasting.  ? ?Your blood pressure looks good.  ?Use the xanax as we discussed. I hope it helps you get some rest.  ? ?If you have any questions or concerns, please don't hesitate to send me a message via MyChart or call the office at [phone]. Thank you for visiting with [place] today! It's our pleasure caring for you.  ? ?Managing Loss, Adult ?People experience loss in many different ways throughout their lives. Events such as moving, changing jobs, and losing friends can create a sense of loss. The loss may be as serious as a major health change, divorce, death of a pet, or death of a loved one. All of these types of loss are likely to create a physical and emotional reaction known as grief. Grief is the result of a major change or an absence of something or someone that you count on. Grief is a normal reaction to loss. ?A variety of factors can affect your grieving experience, including: ?The nature of your loss. ?Your relationship to what or whom you lost. ?Your understanding of grief and how to manage it. ?Your support system. ?Be aware that when grief becomes extreme, it can lead to more severe issues like isolation, depression, anxiety, or suicidal thoughts. Talk with your health care provider if you have any of these issues. ?How to manage lifestyle changes ?Keep to your normal routine as much as possible. ?If you have trouble focusing or doing normal activities, it is acceptable to take some time away from your normal routine. ?Spend time with friends and loved ones. ?Eat a healthy diet, get plenty of sleep, and rest when you feel tired. ?How to recognize changes  ?The way that you deal with your grief will affect your ability to function as you normally do. When grieving, you may experience these changes: ?Numbness, shock, sadness, anxiety, anger, denial, and guilt. ?Thoughts about death. ?Unexpected crying. ?A physical sensation of  emptiness in your stomach. ?Problems sleeping and eating. ?Tiredness (fatigue). ?Loss of interest in normal activities. ?Dreaming about or imagining seeing the person who died. ?A need to remember what or whom you lost. ?Difficulty thinking about anything other than your loss for a period of time. ?Relief. If you have been expecting the loss for a while, you may feel a sense of relief when it happens. ?Follow these instructions at home: ?Activity ?Express your feelings in healthy ways, such as: ?Talking with others about your loss. It may be helpful to find others who have had a similar loss, such as a support group. ?Writing down your feelings in a journal. ?Doing physical activities to release stress and emotional energy. ?Doing creative activities like painting, sculpting, or playing or listening to music. ?Practicing resilience. This is the ability to recover and adjust after facing challenges. Reading some resources that encourage resilience may help you to learn ways to practice those behaviors. ? ?General instructions ?Be patient with yourself and others. Allow the grieving process to happen, and remember that grieving takes time. ?It is likely that you may never feel completely done with some grief. You may find a way to move on while still cherishing memories and feelings about your loss. ?Accepting your loss is a process. It can take months or longer to adjust. ?Keep all follow-up visits. This is important. ?Where to find support ?To get support for managing loss: ?Ask your health care provider for help and recommendations, such as grief counseling or  therapy. ?Think about joining a support group for people who are managing a loss. ?Where to find more information ?You can find more information about managing loss from: ?American Society of Clinical Oncology: www.cancer.net ?American Psychological Association: TVStereos.ch ?Contact a health care provider if: ?Your grief is extreme and keeps getting  worse. ?You have ongoing grief that does not improve. ?Your body shows symptoms of grief, such as illness. ?You feel depressed, anxious, or hopeless. ?Get help right away if: ?You have thoughts about hurting yourself or others. ?Get help right away if you feel like you may hurt yourself or others, or have thoughts about taking your own life. Go to your nearest emergency room or: ?Call 911. ?Call the [place] at [phone] or 988. This is open 24 hours a day. ?Text the Crisis Text Line at 972 428 3917. ?Summary ?Grief is the result of a major change or an absence of someone or something that you count on. Grief is a normal reaction to loss. ?The depth of grief and the period of recovery depend on the type of loss and your ability to adjust to the change and process your feelings. ?Processing grief requires patience and a willingness to accept your feelings and talk about your loss with people who are supportive. ?It is important to find resources that work for you and to realize that people experience grief differently. There is not one grieving process that works for everyone in the same way. ?Be aware that when grief becomes extreme, it can lead to more severe issues like isolation, depression, anxiety, or suicidal thoughts. Talk with your health care provider if you have any of these issues. ?This information is not intended to replace advice given to you by your health care provider. Make sure you discuss any questions you have with your health care provider. ?Document Revised: 11/14/2020 Document Reviewed: 11/14/2020 ?Elsevier Patient Education ? [place]. ? ?

## 2021-07-06 ENCOUNTER — Other Ambulatory Visit: Payer: Self-pay | Admitting: Family Medicine

## 2021-07-06 DIAGNOSIS — Z1231 Encounter for screening mammogram for malignant neoplasm of breast: Secondary | ICD-10-CM

## 2021-07-19 ENCOUNTER — Ambulatory Visit
Admission: RE | Admit: 2021-07-19 | Discharge: 2021-07-19 | Disposition: A | Discharge status: Home / Self Care | Payer: 59 | Source: Ambulatory Visit | Attending: Family Medicine | Admitting: Family Medicine

## 2021-07-19 DIAGNOSIS — Z1231 Encounter for screening mammogram for malignant neoplasm of breast: Secondary | ICD-10-CM

## 2021-07-20 ENCOUNTER — Other Ambulatory Visit: Payer: Self-pay | Admitting: Family Medicine

## 2021-07-20 DIAGNOSIS — R928 Other abnormal and inconclusive findings on diagnostic imaging of breast: Secondary | ICD-10-CM

## 2021-08-04 ENCOUNTER — Ambulatory Visit: Payer: 59

## 2021-08-04 ENCOUNTER — Ambulatory Visit
Admission: RE | Admit: 2021-08-04 | Discharge: 2021-08-04 | Disposition: A | Discharge status: Home / Self Care | Payer: 59 | Source: Ambulatory Visit | Attending: Family Medicine | Admitting: Family Medicine

## 2021-08-04 DIAGNOSIS — R928 Other abnormal and inconclusive findings on diagnostic imaging of breast: Secondary | ICD-10-CM | POA: Diagnosis not present

## 2021-10-02 DIAGNOSIS — Z85038 Personal history of other malignant neoplasm of large intestine: Secondary | ICD-10-CM | POA: Diagnosis not present

## 2021-10-02 DIAGNOSIS — Z6833 Body mass index (BMI) 33.0-33.9, adult: Secondary | ICD-10-CM | POA: Diagnosis not present

## 2021-10-02 DIAGNOSIS — K219 Gastro-esophageal reflux disease without esophagitis: Secondary | ICD-10-CM | POA: Diagnosis not present

## 2021-10-02 DIAGNOSIS — Z1211 Encounter for screening for malignant neoplasm of colon: Secondary | ICD-10-CM | POA: Diagnosis not present

## 2021-10-02 DIAGNOSIS — E7800 Pure hypercholesterolemia, unspecified: Secondary | ICD-10-CM | POA: Diagnosis not present

## 2021-10-02 DIAGNOSIS — E669 Obesity, unspecified: Secondary | ICD-10-CM | POA: Diagnosis not present

## 2021-10-02 DIAGNOSIS — Z87891 Personal history of nicotine dependence: Secondary | ICD-10-CM | POA: Diagnosis not present

## 2021-10-02 DIAGNOSIS — D509 Iron deficiency anemia, unspecified: Secondary | ICD-10-CM | POA: Diagnosis not present

## 2021-10-02 DIAGNOSIS — I10 Essential (primary) hypertension: Secondary | ICD-10-CM | POA: Diagnosis not present

## 2021-10-09 ENCOUNTER — Other Ambulatory Visit (HOSPITAL_COMMUNITY): Payer: Self-pay

## 2021-11-21 DIAGNOSIS — H5213 Myopia, bilateral: Secondary | ICD-10-CM | POA: Diagnosis not present

## 2021-12-06 ENCOUNTER — Other Ambulatory Visit (HOSPITAL_COMMUNITY): Payer: Self-pay

## 2021-12-06 MED ORDER — IBUPROFEN 800 MG PO TABS
800.0000 mg | ORAL_TABLET | Freq: Three times a day (TID) | ORAL | 0 refills | Status: DC
  Filled 2021-12-06 (×2): qty 24, 8d supply, fill #0

## 2021-12-15 ENCOUNTER — Ambulatory Visit (INDEPENDENT_AMBULATORY_CARE_PROVIDER_SITE_OTHER): Payer: 59 | Admitting: Family Medicine

## 2021-12-15 ENCOUNTER — Encounter: Payer: Self-pay | Admitting: Family Medicine

## 2021-12-15 ENCOUNTER — Other Ambulatory Visit (HOSPITAL_COMMUNITY): Payer: Self-pay

## 2021-12-15 VITALS — BP 138/80 | HR 74 | Temp 98.6°F | Ht 63.0 in | Wt 201.8 lb

## 2021-12-15 DIAGNOSIS — I2510 Atherosclerotic heart disease of native coronary artery without angina pectoris: Secondary | ICD-10-CM | POA: Diagnosis not present

## 2021-12-15 DIAGNOSIS — F4321 Adjustment disorder with depressed mood: Secondary | ICD-10-CM

## 2021-12-15 DIAGNOSIS — I10 Essential (primary) hypertension: Secondary | ICD-10-CM | POA: Diagnosis not present

## 2021-12-15 DIAGNOSIS — Z0000 Encounter for general adult medical examination without abnormal findings: Secondary | ICD-10-CM

## 2021-12-15 DIAGNOSIS — E538 Deficiency of other specified B group vitamins: Secondary | ICD-10-CM

## 2021-12-15 DIAGNOSIS — K8020 Calculus of gallbladder without cholecystitis without obstruction: Secondary | ICD-10-CM

## 2021-12-15 DIAGNOSIS — Z85038 Personal history of other malignant neoplasm of large intestine: Secondary | ICD-10-CM

## 2021-12-15 DIAGNOSIS — K760 Fatty (change of) liver, not elsewhere classified: Secondary | ICD-10-CM | POA: Diagnosis not present

## 2021-12-15 DIAGNOSIS — B351 Tinea unguium: Secondary | ICD-10-CM

## 2021-12-15 DIAGNOSIS — I471 Supraventricular tachycardia: Secondary | ICD-10-CM | POA: Diagnosis not present

## 2021-12-15 LAB — CBC WITH DIFFERENTIAL/PLATELET
Basophils Absolute: 0.1 10*3/uL (ref 0.0–0.1)
Basophils Relative: 1.3 % (ref 0.0–3.0)
Eosinophils Absolute: 0.2 10*3/uL (ref 0.0–0.7)
Eosinophils Relative: 5.3 % — ABNORMAL HIGH (ref 0.0–5.0)
HCT: 38.4 % (ref 36.0–46.0)
Hemoglobin: 13.2 g/dL (ref 12.0–15.0)
Lymphocytes Relative: 40.3 % (ref 12.0–46.0)
Lymphs Abs: 1.9 10*3/uL (ref 0.7–4.0)
MCHC: 34.4 g/dL (ref 30.0–36.0)
MCV: 89.4 fl (ref 78.0–100.0)
Monocytes Absolute: 0.4 10*3/uL (ref 0.1–1.0)
Monocytes Relative: 9.1 % (ref 3.0–12.0)
Neutro Abs: 2 10*3/uL (ref 1.4–7.7)
Neutrophils Relative %: 44 % (ref 43.0–77.0)
Platelets: 244 10*3/uL (ref 150.0–400.0)
RBC: 4.29 Mil/uL (ref 3.87–5.11)
RDW: 13 % (ref 11.5–15.5)
WBC: 4.6 10*3/uL (ref 4.0–10.5)

## 2021-12-15 LAB — LIPID PANEL
Cholesterol: 200 mg/dL (ref 0–200)
HDL: 43.9 mg/dL (ref >39.00)
NonHDL: 156.17
Total CHOL/HDL Ratio: 5
Triglycerides: 348 mg/dL — ABNORMAL HIGH (ref 0.0–149.0)
VLDL: 69.6 mg/dL — ABNORMAL HIGH (ref 0.0–40.0)

## 2021-12-15 LAB — COMPREHENSIVE METABOLIC PANEL
ALT: 27 U/L (ref 0–35)
AST: 24 U/L (ref 0–37)
Albumin: 4 g/dL (ref 3.5–5.2)
Alkaline Phosphatase: 73 U/L (ref 39–117)
BUN: 11 mg/dL (ref 6–23)
CO2: 24 mEq/L (ref 19–32)
Calcium: 9.1 mg/dL (ref 8.4–10.5)
Chloride: 106 mEq/L (ref 96–112)
Creatinine, Ser: 0.66 mg/dL (ref 0.40–1.20)
GFR: 99.27 mL/min (ref >60.00)
Glucose, Bld: 80 mg/dL (ref 70–99)
Potassium: 4.6 mEq/L (ref 3.5–5.1)
Sodium: 140 mEq/L (ref 135–145)
Total Bilirubin: 0.2 mg/dL (ref 0.2–1.2)
Total Protein: 6.6 g/dL (ref 6.0–8.3)

## 2021-12-15 LAB — VITAMIN B12: Vitamin B-12: 306 pg/mL (ref 211–911)

## 2021-12-15 LAB — TSH: TSH: 3.11 u[IU]/mL (ref 0.35–5.50)

## 2021-12-15 LAB — LDL CHOLESTEROL, DIRECT: Direct LDL: 118 mg/dL

## 2021-12-15 MED ORDER — CICLOPIROX 8 % EX SOLN
Freq: Every day | CUTANEOUS | 2 refills | Status: DC
  Filled 2021-12-15: qty 6.6, 30d supply, fill #0
  Filled 2022-04-09 – 2022-06-12 (×3): qty 6.6, 30d supply, fill #1

## 2021-12-15 MED ORDER — ALPRAZOLAM 0.5 MG PO TABS
0.5000 mg | ORAL_TABLET | Freq: Every day | ORAL | 0 refills | Status: DC | PRN
  Filled 2021-12-15: qty 30, 30d supply, fill #0

## 2021-12-15 NOTE — Progress Notes (Signed)
Subjective  Chief Complaint  Patient presents with   Annual Exam    Pt here for Annual exam and is currently fasting    HPI: Nicole Rosario is a 56 y.o. female who presents to [facility] [place] today for a Female Wellness Visit. She also has the concerns and/or needs as listed above in the chief complaint. These will be addressed in addition to the Health Maintenance Visit.   Wellness Visit: annual visit with health maintenance review and exam without Pap  Health maintenance: Sees GYN for female wellness.  Pap smear is current.  Mammogram is current and normal.  Had recent colonoscopy in June at [facility], normal without biopsies.  Has every 3 year colonoscopy surveillance due to history of colon cancer.  Will get flu shot at work, [employer].  Will get COVID booster when it comes out.  Other immunizations are current.  Eye exam is current.  Feeling better overall.  Still with some sleep concerns.  Keeping active in the yard. Chronic disease f/u and/or acute problem visit: (deemed necessary to be done in addition to the wellness visit): Essential hypertension: Now treated with Coreg 6.25 mg twice daily.  Home blood pressures average 120s over 70s.  Rare palpitations.  No longer experience PSVT runs.  No chest pain, shortness of breath or lower extremity edema. ASCVD: nonobstructive on baby asa. Cards eval neg with low calcium score and nl lipids, nl lipoprotein A. No cp. B12 deficiency; not on supplements. No longer on chronic PPI.  Cholilithiasis and hepatic steatosis; remains asymptomatic. Due AMA screen.  C/o foot toenail fungus, great toenails only. New problem. No pain.   Assessment  1. Annual physical exam   2. Essential hypertension   3. PSVT (paroxysmal supraventricular tachycardia) ([place])   4. Hepatic steatosis mild by ultrasound   5. H/O colon cancer, stage III   6. Vitamin B12 deficiency   7. Calculus of gallbladder without cholecystitis without  obstruction   8. ASCVD (arteriosclerotic cardiovascular disease)   9. Onychomycosis   10. Grief      Plan  Female Wellness Visit: Age appropriate Health Maintenance and Prevention measures were discussed with patient. Included topics are cancer screening recommendations, ways to keep healthy (see AVS) including dietary and exercise recommendations, regular eye and dental care, use of seat belts, and avoidance of moderate alcohol use and tobacco use. Screens are current BMI: discussed patient's BMI and encouraged positive lifestyle modifications to help get to or maintain a target BMI. HM needs and immunizations were addressed and ordered. See below for orders. See HM and immunization section for updates. Will get flu and covid later this fall Routine labs and screening tests ordered including cmp, cbc and lipids where appropriate. Discussed recommendations regarding Vit D and calcium supplementation (see AVS)  Chronic disease management visit and/or acute problem visit: HTN/palpitations and ASCVD: all stable on asa, coreg 3.125 bid. Continue home monitoring. Rec exercise low fat diet Gallstones and fatty liver: asymptomatic. Monitor lfts and ama annually.  Check b12 [place] for toenail fungus. Educatin given.  Colon cancer w/ normal surveillance.  Grief: xanax prn. Sleep, improving  Follow up: 6 mo for htn  Orders Placed This Encounter  Procedures   CBC with Differential/Platelet   Comprehensive metabolic panel   Lipid panel   TSH   Vitamin B12   Mitochondrial antibodies   Meds ordered this encounter  Medications   ALPRAZolam (XANAX) 0.5 MG tablet    Sig:  Take 1 tablet (0.5 mg total) by mouth daily as needed for anxiety.    Dispense:  30 tablet    Refill:  0   ciclopirox (PENLAC) 8 % solution    Sig: Apply topically at bedtime. Apply over nail and surrounding skin. Apply daily over previous coat. After seven (7) days, may remove with alcohol and continue cycle.    Dispense:  6  mL    Refill:  2      Body mass index is 35.75 kg/m. Wt Readings from Last 3 Encounters:  12/15/21 201 lb 12.8 oz (91.5 kg)  07/03/21 196 lb 9.6 oz (89.2 kg)  03/22/21 194 lb (88 kg)     Patient Active Problem List   Diagnosis Date Noted   ASCVD (arteriosclerotic cardiovascular disease) 03/23/2021    Priority: High    CAC score 7.2 2022; nl lipoprotein A, Cardiology eval. Started bb. Normal echocardiogram The 10-year ASCVD risk score (Arnett DK, et al., 2019) is: 3.8% and nl lipoprotein A. No statin indicated per cards   Values used to calculate the score:     Age: 46 years     Sex: Female     Is Non-Hispanic African American: No     Diabetic: No     Tobacco smoker: No     Systolic Blood Pressure: 407 mmHg     Is BP treated: Yes     HDL Cholesterol: 43 mg/dL     Total Cholesterol: 209 mg/dL     PSVT (paroxysmal supraventricular tachycardia) ([place]) 03/23/2021    Priority: High   Essential hypertension 06/23/2020    Priority: High   Family history of premature CAD 04/22/2019    Priority: High   Obesity (BMI 30-39.9) 09/18/2017    Priority: High   H/O colon cancer, stage III 09/05/2014    Priority: High   Hepatic steatosis mild by ultrasound 09/18/2017    Priority: Medium     Hepatic steatosis and family history of primary biliary cholangitis in mother. Her hepatologist recommends screening with mitochondrial antibodies every 2 years. 2021 done     Hx of left hemicolectomy 09/18/2017    Priority: Medium    Hx of microdiscectomy, L5-S1 09/18/2017    Priority: Medium    GERD (gastroesophageal reflux disease) 07/03/2012    Priority: Medium    Mixed hyperlipidemia 10/08/2011    Priority: Medium    Cholelithiasis 04/22/2019    Priority: Low    Asymptomatic. Incidental finding on ultrasound    Vitamin B12 deficiency 09/18/2017    Priority: Low   Seasonal allergies 10/08/2011    Priority: Overly Maintenance  Topic Date Due   COVID-19 Vaccine (5 - Pfizer  risk series) 12/31/2021 (Originally 12/06/2020)   INFLUENZA VACCINE  07/08/2022 (Originally 11/07/2021)   MAMMOGRAM  07/20/2022   PAP SMEAR-Modifier  11/27/2023   COLONOSCOPY (Pts 45-38yr Insurance coverage will need to be confirmed)  10/02/2024   TETANUS/TDAP  12/05/2027   Hepatitis C Screening  Completed   HIV Screening  Completed   Zoster Vaccines- Shingrix  Completed   HPV VACCINES  Aged Out   Immunization History  Administered Date(s) Administered   Hepatitis B 04/09/1993   Hepatitis B, adult 04/09/1993   Influenza,inj,Quad PF,6+ Mos 01/30/2021   Influenza-Unspecified 01/08/2015, 01/23/2018, 02/01/2019, 01/08/2020   MMR 04/09/1988   PFIZER Comirnaty(Gray Top)Covid-19 Tri-Sucrose Vaccine 10/11/2020   PFIZER(Purple Top)SARS-COV-2 Vaccination 04/04/2019, 04/22/2019, 01/29/2020   Td 12/04/2017   Tdap 04/10/2003   Zoster Recombinat (Shingrix) 06/23/2020, 12/14/2020  We updated and reviewed the patient's past history in detail and it is documented below. Allergies: Patient is allergic to erythromycin. Past Medical History Patient  has a past medical history of Cholelithiasis (04/22/2019), Colon cancer Stage III at splenic flexure; followed at [facility]; s/p surgery and chemo. (04/09/2012), Fatty liver (09/18/2017), GERD (gastroesophageal reflux disease), Hyperlipidemia (10/08/2011), Obesity (BMI 30-39.9) (09/18/2017), Positive TB test, Seasonal allergies (10/08/2011), and Vasomotor symptoms due to menopause (09/18/2017). Past Surgical History Patient  has a past surgical history that includes Colon surgery; Microdiscectomy lumbar (2019); Hemicolectomy (2013); and Lumbar disc surgery. Family History: Patient family history includes Atrial fibrillation in her father and mother; Bladder Cancer in her father; Cancer in her paternal grandmother; Colon cancer in her maternal grandmother; Heart disease in her maternal aunt, maternal grandfather, and mother; Hypertension in her father; Lupus in her sister;  Other in her mother; Parkinson's disease in her mother; Sick sinus syndrome in her mother. Social History:  Patient  reports that she has quit smoking. She has never used smokeless tobacco. She reports current alcohol use. She reports that she does not use drugs.  Review of Systems: Constitutional: negative for fever or malaise Ophthalmic: negative for photophobia, double vision or loss of vision Cardiovascular: negative for chest pain, dyspnea on exertion, or new LE swelling Respiratory: negative for SOB or persistent cough Gastrointestinal: negative for abdominal pain, change in bowel habits or melena Genitourinary: negative for dysuria or gross hematuria, no abnormal uterine bleeding or disharge Musculoskeletal: negative for new gait disturbance or muscular weakness Integumentary: negative for new or persistent rashes, no breast lumps Neurological: negative for TIA or stroke symptoms Psychiatric: negative for SI or delusions Allergic/Immunologic: negative for hives  Patient Care Team    Relationship Specialty Notifications Start End  Leamon Arnt, MD PCP - General Family Medicine  04/22/19   Berniece Salines, DO PCP - Cardiology Cardiology  03/23/21   Erline Levine, MD Consulting Physician Neurosurgery  03/31/18   Crawford Givens, MD Consulting Physician Obstetrics and Gynecology  03/31/18   Helayne Seminole, MD Consulting Physician Otolaryngology  05/16/18   Caralee Ates, PA-C Consulting Physician Hematology and Oncology  06/23/20   Jola Schmidt, MD Consulting Physician Gastroenterology  12/15/21    Comment: [facility]    Objective  Vitals: BP 138/80   Pulse 74   Temp 98.6 F (37 C)   Ht 5' 3"  (1.6 m)   Wt 201 lb 12.8 oz (91.5 kg)   SpO2 96%   BMI 35.75 kg/m  General:  Well developed, well nourished, no acute distress  Psych:  Alert and orientedx3,normal mood and affect HEENT:  Normocephalic, atraumatic, non-icteric sclera,  supple neck without adenopathy, mass or  thyromegaly Cardiovascular:  Normal S1, S2, RRR without gallop, rub or murmur Respiratory:  Good breath sounds bilaterally, CTAB with normal respiratory effort Gastrointestinal: normal bowel sounds, soft, non-tender, no noted masses. No HSM MSK: no deformities, contusions. Joints are without erythema or swelling.  Skin:  Warm, no rashes or suspicious lesions noted Neurologic:    Mental status is normal. CN 2-11 are normal. Gross motor and sensory exams are normal. Normal gait. No tremor Mild toenail discoloration part of both great toenails.  Commons side effects, risks, benefits, and alternatives for medications and treatment plan prescribed today were discussed, and the patient expressed understanding of the given instructions. Patient is instructed to call or message via MyChart if he/she has any questions or concerns regarding our treatment plan. No barriers to understanding were  identified. We discussed Red Flag symptoms and signs in detail. Patient expressed understanding regarding what to do in case of urgent or emergency type symptoms.  Medication list was reconciled, printed and provided to the patient in AVS. Patient instructions and summary information was reviewed with the patient as documented in the AVS. This note was prepared with assistance of Dragon voice recognition software. Occasional wrong-word or sound-a-like substitutions may have occurred due to the inherent limitations of voice recognition software  This visit occurred during the SARS-CoV-2 public health emergency.  Safety protocols were in place, including screening questions prior to the visit, additional usage of staff PPE, and extensive cleaning of exam room while observing appropriate contact time as indicated for disinfecting solutions.

## 2021-12-15 NOTE — Patient Instructions (Signed)
Please return in 6 months for hypertension follow up.   I will release your lab results to you on your MyChart account with further instructions. You may see the results before I do, but when I review them I will send you a message with my report or have my assistant call you if things need to be discussed. Please reply to my message with any questions. Thank you!   If you have any questions or concerns, please don't hesitate to send me a message via MyChart or call the office at [phone]. Thank you for visiting with [place] today! It's our pleasure caring for you.   Fungal Nail Infection A fungal nail infection is a common infection of the toenails or fingernails. This condition affects toenails more often than fingernails. It often affects the great, or big, toes. More than one nail may be infected. The condition can be passed from person to person (is contagious). What are the causes? This condition is caused by a fungus, such as yeast or molds. Several types of fungi can cause the infection. These fungi are common in moist and warm areas. If your hands or feet come into contact with the fungus, it may get into a crack in your fingernail or toenail or in the surrounding skin, and cause an infection. What increases the risk? The following factors may make you more likely to develop this condition: Being of older age. Having certain medical conditions, such as: Athlete's foot. Diabetes. Poor circulation. A weak body defense system (immune system). Walking barefoot in areas where the fungus thrives, such as showers or locker rooms. Wearing shoes and socks that cause your feet to sweat. Having a nail injury or a recent nail surgery. What are the signs or symptoms? Symptoms of this condition include: A pale spot on the nail. Thickening of the nail. A nail that becomes yellow, brown, or white. A brittle or ragged nail edge. A nail that has lifted away from the nail bed. How is this  diagnosed? This condition is diagnosed with a physical exam. Your health care provider may take a scraping or clipping from your nail to test for the fungus. How is this treated? Treatment is not needed for mild infections. If you have significant nail changes, treatment may include: Antifungal medicines taken by mouth (orally). You may need to take the medicine for several weeks or several months, and you may not see the results for a long time. These medicines can cause side effects. Ask your health care provider what problems to watch for. Antifungal nail polish or nail cream. These may be used along with oral antifungal medicines. Laser treatment of the nail. Surgery to remove the nail. This may be needed for the most severe infections. It can take a long time, usually up to a year, for the infection to go away. The infection may also come back. Follow these instructions at home: Medicines Take or apply over-the-counter and prescription medicines only as told by your health care provider. Ask your health care provider about using over-the-counter mentholated ointment on your nails. Nail care Trim your nails often. Wash and dry your hands and feet every day. Keep your feet dry. To do this: Wear absorbent socks, and change your socks frequently. Wear shoes that allow air to circulate, such as sandals or canvas tennis shoes. Throw out old shoes. If you go to a nail salon, make sure you choose one that uses clean instruments. Use antifungal foot powder on your feet and  in your shoes. General instructions Do not share personal items, such as towels or nail clippers. Do not walk barefoot in shower rooms or locker rooms. Wear rubber gloves if you are working with your hands in wet areas. Keep all follow-up visits. This is important. Contact a health care provider if: You have redness, pain, or pus near the toenail or fingernail. Your infection is not getting better, or it is getting worse  after several months. You have more circulation problems near the toenail or fingernail. You have brown or black discoloration of the nail that spreads to the surrounding skin. Summary A fungal nail infection is a common infection of the toenails or fingernails. Treatment is not needed for mild infections. If you have significant nail changes, treatment may include taking medicine orally and applying medicine to your nails. It can take a long time, usually up to a year, for the infection to go away. The infection may also come back. Take or apply over-the-counter and prescription medicines only as told by your health care provider. This information is not intended to replace advice given to you by your health care provider. Make sure you discuss any questions you have with your health care provider. Document Revised: 06/27/2020 Document Reviewed: 06/27/2020 Elsevier Patient Education  [place].

## 2021-12-18 LAB — MITOCHONDRIAL ANTIBODIES: Mitochondrial M2 Ab, IgG: <=20 U (ref <=20.0)

## 2022-01-15 ENCOUNTER — Other Ambulatory Visit (HOSPITAL_COMMUNITY): Payer: Self-pay

## 2022-01-17 ENCOUNTER — Other Ambulatory Visit (HOSPITAL_COMMUNITY): Payer: Self-pay

## 2022-01-17 MED ORDER — IBUPROFEN 800 MG PO TABS
800.0000 mg | ORAL_TABLET | Freq: Three times a day (TID) | ORAL | 0 refills | Status: DC
  Filled 2022-01-17: qty 24, 8d supply, fill #0

## 2022-04-09 ENCOUNTER — Other Ambulatory Visit: Payer: Self-pay | Admitting: Family Medicine

## 2022-04-09 ENCOUNTER — Other Ambulatory Visit (HOSPITAL_COMMUNITY): Payer: Self-pay

## 2022-04-09 ENCOUNTER — Other Ambulatory Visit: Payer: Self-pay | Admitting: Cardiology

## 2022-04-10 ENCOUNTER — Other Ambulatory Visit: Payer: Self-pay

## 2022-04-10 ENCOUNTER — Other Ambulatory Visit (HOSPITAL_COMMUNITY): Payer: Self-pay

## 2022-04-10 MED ORDER — CARVEDILOL 3.125 MG PO TABS
3.1250 mg | ORAL_TABLET | Freq: Two times a day (BID) | ORAL | 1 refills | Status: DC
  Filled 2022-04-10 – 2022-04-16 (×2): qty 60, 30d supply, fill #0

## 2022-04-11 ENCOUNTER — Other Ambulatory Visit (HOSPITAL_COMMUNITY): Payer: Self-pay

## 2022-04-11 MED ORDER — ALPRAZOLAM 0.5 MG PO TABS
0.5000 mg | ORAL_TABLET | Freq: Every day | ORAL | 0 refills | Status: DC | PRN
  Filled 2022-04-11: qty 30, 30d supply, fill #0

## 2022-04-12 ENCOUNTER — Other Ambulatory Visit: Payer: Self-pay

## 2022-04-16 ENCOUNTER — Other Ambulatory Visit: Payer: Self-pay

## 2022-04-16 ENCOUNTER — Encounter (HOSPITAL_COMMUNITY): Payer: Self-pay

## 2022-04-16 ENCOUNTER — Other Ambulatory Visit (HOSPITAL_BASED_OUTPATIENT_CLINIC_OR_DEPARTMENT_OTHER): Payer: Self-pay

## 2022-04-16 ENCOUNTER — Other Ambulatory Visit (HOSPITAL_COMMUNITY): Payer: Self-pay

## 2022-04-16 MED ORDER — COMIRNATY 30 MCG/0.3ML IM SUSY
PREFILLED_SYRINGE | INTRAMUSCULAR | 0 refills | Status: DC
  Filled 2022-04-16: qty 0.3, 1d supply, fill #0

## 2022-04-17 ENCOUNTER — Other Ambulatory Visit (HOSPITAL_COMMUNITY): Payer: Self-pay

## 2022-05-02 ENCOUNTER — Ambulatory Visit: Payer: 59 | Attending: Cardiology | Admitting: Cardiology

## 2022-05-02 ENCOUNTER — Other Ambulatory Visit (HOSPITAL_COMMUNITY): Payer: Self-pay

## 2022-05-02 ENCOUNTER — Encounter: Payer: Self-pay | Admitting: Cardiology

## 2022-05-02 VITALS — BP 128/84 | HR 87 | Ht 63.0 in | Wt 198.0 lb

## 2022-05-02 DIAGNOSIS — E781 Pure hyperglyceridemia: Secondary | ICD-10-CM | POA: Diagnosis not present

## 2022-05-02 DIAGNOSIS — I471 Supraventricular tachycardia, unspecified: Secondary | ICD-10-CM | POA: Diagnosis not present

## 2022-05-02 DIAGNOSIS — I10 Essential (primary) hypertension: Secondary | ICD-10-CM

## 2022-05-02 MED ORDER — CARVEDILOL 3.125 MG PO TABS
3.1250 mg | ORAL_TABLET | Freq: Two times a day (BID) | ORAL | 3 refills | Status: DC
  Filled 2022-05-02 – 2022-05-11 (×2): qty 180, 90d supply, fill #0
  Filled 2022-08-27: qty 180, 90d supply, fill #1
  Filled 2022-11-13: qty 180, 90d supply, fill #2

## 2022-05-02 NOTE — Progress Notes (Signed)
Cardiology Office Note:    Date:  05/02/2022   ID:  SHIARA MCGOUGH, DOB [birth year], MRN [id number]  PCP:  Leamon Arnt, MD  Cardiologist:  Berniece Salines, DO  Electrophysiologist:  None   Referring MD: Leamon Arnt, MD    History of Present Illness:    Lakynn Halvorsen Wynes is a 57 y.o. female with a hx of hyperlipidemia, obesity, hypertension, paroxysmal SVT here today for follow-up visit.  I last saw the patient on March 22, 2021 at that time I started the patient on carvedilol 3.'125mg'$  BID.  Since her last visit she has been doing well.  She offers no complaints at this time.    Past Medical History:  Diagnosis Date   Cholelithiasis 04/22/2019   Colon cancer Stage III at splenic flexure; followed at [facility]; s/p surgery and chemo. 04/09/2012   Fatty liver 09/18/2017   GERD (gastroesophageal reflux disease)    Hyperlipidemia 10/08/2011   Obesity (BMI 30-39.9) 09/18/2017   Positive TB test    Seasonal allergies 10/08/2011   Vasomotor symptoms due to menopause 09/18/2017    Past Surgical History:  Procedure Laterality Date   COLON SURGERY     HEMICOLECTOMY  2013   LUMBAR DISC SURGERY     MICRODISCECTOMY LUMBAR  2019    Current Medications: Current Meds  Medication Sig   ALPRAZolam (XANAX) 0.5 MG tablet Take 1 tablet (0.5 mg total) by mouth daily as needed for anxiety.   aspirin EC 81 MG tablet Take 1 tablet by mouth daily.   Cholecalciferol (VITAMIN D3) 125 MCG (5000 UT) TABS Take 5,000 Units by mouth daily.   ciclopirox (PENLAC) 8 % solution Apply topically at bedtime. Apply over nail and surrounding skin. Apply daily over previous coat. After seven (7) days, may remove with alcohol and continue cycle.   COVID-19 mRNA vaccine 2023-2024 (COMIRNATY) syringe Inject into the muscle.   cyanocobalamin (VITAMIN B12) 1000 MCG tablet Take 1,000 mcg by mouth daily.   ferrous sulfate (SLOW RELEASE IRON) 160 (50 Fe) MG TBCR SR tablet Take 160 mg by mouth daily.   Omega-3 Fatty Acids (FISH OIL)  1000 MG CPDR Take 1 tablet by mouth daily.   [DISCONTINUED] carvedilol (COREG) 3.125 MG tablet Take 1 tablet (3.125 mg total) by mouth 2 (two) times daily. *Pt need appt for additional refills*     Allergies:   Erythromycin   Social History   Socioeconomic History   Marital status: Married    Spouse name: Vania Rea   Number of children: 0   Years of education: Not on file   Highest education level: Associate degree: occupational, Hotel manager, or vocational program  Occupational History   Occupation: Programmer, multimedia: [employer]    Comment: [employer]  Tobacco Use   Smoking status: Former   Smokeless tobacco: Never  Scientific laboratory technician Use: Never used  Substance and Sexual Activity   Alcohol use: Yes    Alcohol/week: 0.0 standard drinks of alcohol    Comment: social   Drug use: Never   Sexual activity: Yes    Birth control/protection: Post-menopausal  Other Topics Concern   Not on file  Social History Narrative   Not on file   Social Determinants of Health   Financial Resource Strain: Not on file  Food Insecurity: Not on file  Transportation Needs: Not on file  Physical Activity: Not on file  Stress: Not on file  Social Connections: Not on file  Family History: The patient's family history includes Atrial fibrillation in her father and mother; Bladder Cancer in her father; Cancer in her paternal grandmother; Colon cancer in her maternal grandmother; Heart disease in her maternal aunt, maternal grandfather, and mother; Hypertension in her father; Lupus in her sister; Other in her mother; Parkinson's disease in her mother; Sick sinus syndrome in her mother.  ROS:   Review of Systems  Constitution: Negative for decreased appetite, fever and weight gain.  HENT: Negative for congestion, ear discharge, hoarse voice and sore throat.   Eyes: Negative for discharge, redness, vision loss in right eye and visual halos.  Cardiovascular: Negative for chest pain, dyspnea on exertion,  leg swelling, orthopnea and palpitations.  Respiratory: Negative for cough, hemoptysis, shortness of breath and snoring.   Endocrine: Negative for heat intolerance and polyphagia.  Hematologic/Lymphatic: Negative for bleeding problem. Does not bruise/bleed easily.  Skin: Negative for flushing, nail changes, rash and suspicious lesions.  Musculoskeletal: Negative for arthritis, joint pain, muscle cramps, myalgias, neck pain and stiffness.  Gastrointestinal: Negative for abdominal pain, bowel incontinence, diarrhea and excessive appetite.  Genitourinary: Negative for decreased libido, genital sores and incomplete emptying.  Neurological: Negative for brief paralysis, focal weakness, headaches and loss of balance.  Psychiatric/Behavioral: Negative for altered mental status, depression and suicidal ideas.  Allergic/Immunologic: Negative for HIV exposure and persistent infections.    EKGs/Labs/Other Studies Reviewed:    The following studies were reviewed today:   EKG:  None today   TTE 04/12/2021 IMPRESSIONS   1. Left ventricular ejection fraction, by estimation, is 60 to 65%. The  left ventricle has normal function. The left ventricle has no regional  wall motion abnormalities. Left ventricular diastolic parameters are  consistent with Grade I diastolic  dysfunction (impaired relaxation). The average left ventricular global  longitudinal strain is -21.8 %. The global longitudinal strain is normal.   2. Right ventricular systolic function is normal. The right ventricular  size is normal.   3. The mitral valve is normal in structure. Trivial mitral valve  regurgitation. No evidence of mitral stenosis.   4. The aortic valve is normal in structure. Aortic valve regurgitation is  not visualized. No aortic stenosis is present.   5. The inferior vena cava is normal in size with greater than 50%  respiratory variability, suggesting right atrial pressure of 3 mmHg.   FINDINGS   Left  Ventricle: Left ventricular ejection fraction, by estimation, is 60  to 65%. The left ventricle has normal function. The left ventricle has no  regional wall motion abnormalities. The average left ventricular global  longitudinal strain is -21.8 %.  The global longitudinal strain is normal. The left ventricular internal  cavity size was normal in size. There is no left ventricular hypertrophy.  Left ventricular diastolic parameters are consistent with Grade I  diastolic dysfunction (impaired  relaxation).   Right Ventricle: The right ventricular size is normal. No increase in  right ventricular wall thickness. Right ventricular systolic function is  normal.   Left Atrium: Left atrial size was normal in size.   Right Atrium: Right atrial size was normal in size.   Pericardium: There is no evidence of pericardial effusion.   Mitral Valve: The mitral valve is normal in structure. Trivial mitral  valve regurgitation. No evidence of mitral valve stenosis.   Tricuspid Valve: The tricuspid valve is normal in structure. Tricuspid  valve regurgitation is not demonstrated. No evidence of tricuspid  stenosis.   Aortic Valve: The aortic valve  is normal in structure. Aortic valve  regurgitation is not visualized. No aortic stenosis is present.   Pulmonic Valve: The pulmonic valve was normal in structure. Pulmonic valve  regurgitation is not visualized. No evidence of pulmonic stenosis.   Aorta: The aortic root is normal in size and structure.   Venous: The inferior vena cava is normal in size with greater than 50%  respiratory variability, suggesting right atrial pressure of 3 mmHg.   IAS/Shunts: No atrial level shunt detected by color flow Doppler.    Coronary calcium CT 04/12/2021 FINDINGS: Coronary arteries: Normal origins.   Coronary Calcium Score:   Left main: 0   Left anterior descending artery: 7.02   Left circumflex artery: 0   Right coronary artery: 0   Total: 7.02    Percentile: 82   Pericardium: Normal.   Ascending Aorta: Normal caliber.   Non-cardiac: See separate report from [facility].   IMPRESSION: Coronary calcium score of 7.02. This was 40 percentile for age-, race-, and sex-matched controls.   Zio monitor 03/10/2021 Patch Wear Time:  7 days and 0 hours (2022-11-17T08:57:11-498 to 2022-11-24T09:33:21-0500)   Sinus rhythm (Minimum HR of 49 bpm, max HR of 182 bpm, and avg HR of 80 bpm).   3 Supraventricular Tachycardia runs occurred, the run with the fastest interval lasting 4 beats with a max rate of 182 bpm, the longest lasting 9 beats with an avg rate of 134 bpm. Supraventricular Tachycardia was detected within +/- 45 seconds of symptomatic patient event(s). Isolated PVCs (<1.0%, 1632).  Recent Labs: 12/15/2021: ALT 27; BUN 11; Creatinine, Ser 0.66; Hemoglobin 13.2; Platelets 244.0; Potassium 4.6; Sodium 140; TSH 3.11  Recent Lipid Panel    Component Value Date/Time   CHOL 200 12/15/2021 1032   TRIG 348.0 (H) 12/15/2021 1032   HDL 43.90 12/15/2021 1032   CHOLHDL 5 12/15/2021 1032   VLDL 69.6 (H) 12/15/2021 1032   LDLCALC 161 (H) 11/23/2019 0958   LDLDIRECT 118.0 12/15/2021 1032    Physical Exam:    VS:  BP 128/84   Pulse 87   Ht '5\' 3"'$  (1.6 m)   Wt 89.8 kg   SpO2 99%   BMI 35.07 kg/m     Wt Readings from Last 3 Encounters:  05/02/22 89.8 kg  12/15/21 91.5 kg  07/03/21 89.2 kg     GEN: Well nourished, well developed in no acute distress HEENT: Normal NECK: No JVD; No carotid bruits LYMPHATICS: No lymphadenopathy CARDIAC: S1S2 noted,RRR, no murmurs, rubs, gallops RESPIRATORY:  Clear to auscultation without rales, wheezing or rhonchi  ABDOMEN: Soft, non-tender, non-distended, +bowel sounds, no guarding. EXTREMITIES: No edema, No cyanosis, no clubbing MUSCULOSKELETAL:  No deformity  SKIN: Warm and dry NEUROLOGIC:  Alert and oriented x 3, non-focal PSYCHIATRIC:  Normal affect, good insight  ASSESSMENT:     1. Essential hypertension   2. PSVT (paroxysmal supraventricular tachycardia)   3. Hypertriglyceridemia    PLAN:      Clinically she is doing well from a cardiovascular standpoint.  No change in medication at this time.  The patient understands the need to lose weight with diet and exercise. We have discussed specific strategies for this.  Started on fish oil by her primary care physician for elevated triglycerides plan to repeat blood work in March 2024.  The patient is in agreement with the above plan. The patient left the office in stable condition.  The patient will follow up in   Medication Adjustments/Labs and Tests Ordered: Current medicines are  reviewed at length with the patient today.  Concerns regarding medicines are outlined above.  No orders of the defined types were placed in this encounter.  Meds ordered this encounter  Medications   carvedilol (COREG) 3.125 MG tablet    Sig: Take 1 tablet (3.125 mg total) by mouth 2 (two) times daily.    Dispense:  180 tablet    Refill:  3    Patient Instructions  Medication Instructions:  Your physician recommends that you continue on your current medications as directed. Please refer to the Current Medication list given to you today.  *If you need a refill on your cardiac medications before your next appointment, please call your pharmacy*   Lab Work: None  Testing/Procedures: None   Follow-Up: At [facility], you and your health needs are our priority.  As part of our continuing mission to provide you with exceptional heart care, we have created designated Provider Care Teams.  These Care Teams include your primary Cardiologist (physician) and Advanced Practice Providers (APPs -  Physician Assistants and Nurse Practitioners) who all work together to provide you with the care you need, when you need it.  We recommend signing up for the patient portal called "MyChart".  Sign up information is provided on this  After Visit Summary.  MyChart is used to connect with patients for Virtual Visits (Telemedicine).  Patients are able to view lab/test results, encounter notes, upcoming appointments, etc.  Non-urgent messages can be sent to your provider as well.   To learn more about what you can do with MyChart, go to [email or web address].    Your next appointment:   1 year(s)  Provider:   Berniece Salines, DO        Adopting a Healthy Lifestyle.  Know what a healthy weight is for you (roughly BMI <25) and aim to maintain this   Aim for 7+ servings of fruits and vegetables daily   65-80+ fluid ounces of water or unsweet tea for healthy kidneys   Limit to max 1 drink of alcohol per day; avoid smoking/tobacco   Limit animal fats in diet for cholesterol and heart health - choose grass fed whenever available   Avoid highly processed foods, and foods high in saturated/trans fats   Aim for low stress - take time to unwind and care for your mental health   Aim for 150 min of moderate intensity exercise weekly for heart health, and weights twice weekly for bone health   Aim for 7-9 hours of sleep daily   When it comes to diets, agreement about the perfect plan isnt easy to find, even among the experts. Experts at the [facility] developed an idea known as the Healthy Eating Plate. Just imagine a plate divided into logical, healthy portions.   The emphasis is on diet quality:   Load up on vegetables and fruits - one-half of your plate: Aim for color and variety, and remember that potatoes dont count.   Go for whole grains - one-quarter of your plate: Whole wheat, barley, wheat berries, quinoa, oats, brown rice, and foods made with them. If you want pasta, go with whole wheat pasta.   Protein power - one-quarter of your plate: Fish, chicken, beans, and nuts are all healthy, versatile protein sources. Limit red meat.   The diet, however, does go beyond the plate, offering a few  other suggestions.   Use healthy plant oils, such as olive, canola, soy, corn, sunflower and peanut. Check  the labels, and avoid partially hydrogenated oil, which have unhealthy trans fats.   If youre thirsty, drink water. Coffee and tea are good in moderation, but skip sugary drinks and limit milk and dairy products to one or two daily servings.   The type of carbohydrate in the diet is more important than the amount. Some sources of carbohydrates, such as vegetables, fruits, whole grains, and beans-are healthier than others.   Finally, stay active  Rolly Pancake, DO  05/02/2022 9:46 PM    [facility]

## 2022-05-02 NOTE — Patient Instructions (Signed)
Medication Instructions:  Your physician recommends that you continue on your current medications as directed. Please refer to the Current Medication list given to you today.  *If you need a refill on your cardiac medications before your next appointment, please call your pharmacy*   Lab Work: None  Testing/Procedures: None   Follow-Up: At [facility], you and your health needs are our priority.  As part of our continuing mission to provide you with exceptional heart care, we have created designated Provider Care Teams.  These Care Teams include your primary Cardiologist (physician) and Advanced Practice Providers (APPs -  Physician Assistants and Nurse Practitioners) who all work together to provide you with the care you need, when you need it.  We recommend signing up for the patient portal called "MyChart".  Sign up information is provided on this After Visit Summary.  MyChart is used to connect with patients for Virtual Visits (Telemedicine).  Patients are able to view lab/test results, encounter notes, upcoming appointments, etc.  Non-urgent messages can be sent to your provider as well.   To learn more about what you can do with MyChart, go to [email or web address].    Your next appointment:   1 year(s)  Provider:   Berniece Salines, DO

## 2022-05-12 ENCOUNTER — Other Ambulatory Visit (HOSPITAL_COMMUNITY): Payer: Self-pay

## 2022-05-14 ENCOUNTER — Other Ambulatory Visit: Payer: Self-pay

## 2022-06-11 DIAGNOSIS — C185 Malignant neoplasm of splenic flexure: Secondary | ICD-10-CM | POA: Diagnosis not present

## 2022-06-11 DIAGNOSIS — C189 Malignant neoplasm of colon, unspecified: Secondary | ICD-10-CM | POA: Diagnosis not present

## 2022-06-12 ENCOUNTER — Other Ambulatory Visit (HOSPITAL_COMMUNITY): Payer: Self-pay

## 2022-06-12 MED ORDER — IBUPROFEN 800 MG PO TABS
800.0000 mg | ORAL_TABLET | Freq: Three times a day (TID) | ORAL | 0 refills | Status: DC
  Filled 2022-06-12: qty 24, 8d supply, fill #0

## 2022-06-15 ENCOUNTER — Ambulatory Visit (INDEPENDENT_AMBULATORY_CARE_PROVIDER_SITE_OTHER): Payer: 59 | Admitting: Family Medicine

## 2022-06-15 ENCOUNTER — Encounter: Payer: Self-pay | Admitting: Family Medicine

## 2022-06-15 VITALS — BP 129/78 | HR 76 | Temp 98.3°F | Ht 63.0 in | Wt 202.8 lb

## 2022-06-15 DIAGNOSIS — E781 Pure hyperglyceridemia: Secondary | ICD-10-CM | POA: Diagnosis not present

## 2022-06-15 DIAGNOSIS — I2510 Atherosclerotic heart disease of native coronary artery without angina pectoris: Secondary | ICD-10-CM

## 2022-06-15 DIAGNOSIS — I471 Supraventricular tachycardia, unspecified: Secondary | ICD-10-CM | POA: Diagnosis not present

## 2022-06-15 DIAGNOSIS — E538 Deficiency of other specified B group vitamins: Secondary | ICD-10-CM

## 2022-06-15 DIAGNOSIS — I10 Essential (primary) hypertension: Secondary | ICD-10-CM

## 2022-06-15 DIAGNOSIS — K8020 Calculus of gallbladder without cholecystitis without obstruction: Secondary | ICD-10-CM | POA: Diagnosis not present

## 2022-06-15 NOTE — Patient Instructions (Signed)
Please return in 6 months for your annual complete physical; please come fasting.   If you have any questions or concerns, please don't hesitate to send me a message via MyChart or call the office at [phone]. Thank you for visiting with us today! It's our pleasure caring for you.  

## 2022-06-15 NOTE — Progress Notes (Signed)
Subjective  CC:  Chief Complaint  Patient presents with   Hypertension    HPI: Nicole Rosario is a 57 y.o. female who presents to the office today to address the problems listed above in the chief complaint. Hypertension f/u: Control is fair . Pt reports she is doing well. taking medications as instructed, no medication side effects noted, no TIAs, no chest pain on exertion, no dyspnea on exertion, no swelling of ankles. Coreg 3.25 bid and bp avg 130s/80s. No palpitations. She denies adverse effects from his BP medications. Compliance with medication is good.  Taking fish oil bid for trigs. Will start eating better now that weather is better and become more active On b12 supplements now No biliary colic; gall stones    Assessment  1. Essential hypertension   2. ASCVD (arteriosclerotic cardiovascular disease)   3. Vitamin B12 deficiency   4. Calculus of gallbladder without cholecystitis without obstruction   5. PSVT (paroxysmal supraventricular tachycardia)   6. Hypertriglyceridemia      Plan   Hypertension f/u: BP control is fairly well controlled. Will increase coreg to 6.25 bid and monitor.  Hypertrigs: will check fasting lipids at cpe. Continue fish oil. No statin per cards at this time indicated.  Non obstructive CAD: control risk factors discussion Continue beta blocker for psvt Continue fish oil for trigs and low fat diet. Has hepatic steatosis as well.   Education regarding management of these chronic disease states was given. Management strategies discussed on successive visits include dietary and exercise recommendations, goals of achieving and maintaining IBW, and lifestyle modifications aiming for adequate sleep and minimizing stressors.   Follow up: 6 mo for cpe  No orders of the defined types were placed in this encounter.  No orders of the defined types were placed in this encounter.     BP Readings from Last 3 Encounters:  06/15/22 129/78  05/02/22 128/84   12/15/21 138/80   Wt Readings from Last 3 Encounters:  06/15/22 202 lb 12.8 oz (92 kg)  05/02/22 198 lb (89.8 kg)  12/15/21 201 lb 12.8 oz (91.5 kg)    Lab Results  Component Value Date   CHOL 200 12/15/2021   CHOL 209 (H) 12/14/2020   CHOL 252 (H) 11/23/2019   Lab Results  Component Value Date   HDL 43.90 12/15/2021   HDL 43.00 12/14/2020   HDL 50 11/23/2019   Lab Results  Component Value Date   LDLCALC 161 (H) 11/23/2019   LDLCALC 111 04/12/2017   Lab Results  Component Value Date   TRIG 348.0 (H) 12/15/2021   TRIG 251.0 (H) 12/14/2020   TRIG 248 (H) 11/23/2019   Lab Results  Component Value Date   CHOLHDL 5 12/15/2021   CHOLHDL 5 12/14/2020   CHOLHDL 5.0 (H) 11/23/2019   Lab Results  Component Value Date   LDLDIRECT 118.0 12/15/2021   LDLDIRECT 125.0 12/14/2020   LDLDIRECT 107.0 10/22/2018   Lab Results  Component Value Date   CREATININE 0.66 12/15/2021   BUN 11 12/15/2021   NA 140 12/15/2021   K 4.6 12/15/2021   CL 106 12/15/2021   CO2 24 12/15/2021    The 10-year ASCVD risk score (Arnett DK, et al., 2019) is: 3.4%   Values used to calculate the score:     Age: 89 years     Sex: Female     Is Non-Hispanic African American: No     Diabetic: No     Tobacco smoker: No  Systolic Blood Pressure: [id number] mmHg     Is BP treated: Yes     HDL Cholesterol: 43.9 mg/dL     Total Cholesterol: 200 mg/dL  I reviewed the patients updated PMH, FH, and SocHx.    Patient Active Problem List   Diagnosis Date Noted   ASCVD (arteriosclerotic cardiovascular disease) 03/23/2021    Priority: High   PSVT (paroxysmal supraventricular tachycardia) 03/23/2021    Priority: High   Essential hypertension 06/23/2020    Priority: High   Family history of premature CAD 04/22/2019    Priority: High   Obesity (BMI 30-39.9) 09/18/2017    Priority: High   H/O colon cancer, stage III 09/05/2014    Priority: High   Hepatic steatosis mild by ultrasound 09/18/2017     Priority: Medium    Hx of left hemicolectomy 09/18/2017    Priority: Medium    Hx of microdiscectomy, L5-S1 09/18/2017    Priority: Medium    GERD (gastroesophageal reflux disease) 07/03/2012    Priority: Medium    Mixed hyperlipidemia 10/08/2011    Priority: Medium    Cholelithiasis 04/22/2019    Priority: Low   Vitamin B12 deficiency 09/18/2017    Priority: Low   Seasonal allergies 10/08/2011    Priority: Low   Hypertriglyceridemia 06/15/2022    Allergies: Erythromycin  Social History: Patient  reports that she has quit smoking. She has never used smokeless tobacco. She reports current alcohol use. She reports that she does not use drugs.  Current Meds  Medication Sig   ALPRAZolam (XANAX) 0.5 MG tablet Take 1 tablet (0.5 mg total) by mouth daily as needed for anxiety.   aspirin EC 81 MG tablet Take 1 tablet by mouth daily.   carvedilol (COREG) 3.125 MG tablet Take 1 tablet (3.125 mg total) by mouth 2 (two) times daily.   Cholecalciferol (VITAMIN D3) 125 MCG (5000 UT) TABS Take 5,000 Units by mouth daily.   ciclopirox (PENLAC) 8 % solution Apply topically at bedtime. Apply over nail and surrounding skin. Apply daily over previous coat. After seven (7) days, may remove with alcohol and continue cycle.   cyanocobalamin (VITAMIN B12) 1000 MCG tablet Take 1,000 mcg by mouth daily.   ferrous sulfate (SLOW RELEASE IRON) 160 (50 Fe) MG TBCR SR tablet Take 160 mg by mouth daily.   ibuprofen (ADVIL) 800 MG tablet Take 1 tablet (800 mg total) by mouth 3 (three) times daily.   Omega-3 Fatty Acids (FISH OIL) 1000 MG CPDR Take 1 tablet by mouth daily.   [DISCONTINUED] COVID-19 mRNA vaccine 2023-2024 (COMIRNATY) syringe Inject into the muscle.    Review of Systems: Cardiovascular: negative for chest pain, palpitations, leg swelling, orthopnea Respiratory: negative for SOB, wheezing or persistent cough Gastrointestinal: negative for abdominal pain Genitourinary: negative for dysuria or  gross hematuria  Objective  Vitals: BP 129/78   Pulse 76   Temp 98.3 F (36.8 C)   Ht '5\' 3"'$  (1.6 m)   Wt 202 lb 12.8 oz (92 kg)   SpO2 96%   BMI 35.92 kg/m  General: no acute distress  Psych:  Alert and oriented, normal mood and affect  Cardiovascular:  RRR with systolic murmur Respiratory:  Good breath sounds bilaterally, CTAB with normal respiratory effort  Commons side effects, risks, benefits, and alternatives for medications and treatment plan prescribed today were discussed, and the patient expressed understanding of the given instructions. Patient is instructed to call or message via MyChart if he/she has any questions or concerns regarding our  treatment plan. No barriers to understanding were identified. We discussed Red Flag symptoms and signs in detail. Patient expressed understanding regarding what to do in case of urgent or emergency type symptoms.  Medication list was reconciled, printed and provided to the patient in AVS. Patient instructions and summary information was reviewed with the patient as documented in the AVS. This note was prepared with assistance of Dragon voice recognition software. Occasional wrong-word or sound-a-like substitutions may have occurred due to the inherent limitation

## 2022-08-27 ENCOUNTER — Other Ambulatory Visit (HOSPITAL_COMMUNITY): Payer: Self-pay

## 2022-08-30 ENCOUNTER — Other Ambulatory Visit (HOSPITAL_COMMUNITY): Payer: Self-pay

## 2022-08-30 DIAGNOSIS — M25562 Pain in left knee: Secondary | ICD-10-CM | POA: Diagnosis not present

## 2022-08-30 MED ORDER — PREDNISONE 10 MG PO TABS
ORAL_TABLET | ORAL | 0 refills | Status: DC
  Filled 2022-08-30: qty 20, 8d supply, fill #0

## 2022-09-10 ENCOUNTER — Other Ambulatory Visit: Payer: Self-pay | Admitting: Family Medicine

## 2022-09-10 ENCOUNTER — Encounter: Payer: Self-pay | Admitting: Family Medicine

## 2022-09-11 ENCOUNTER — Other Ambulatory Visit: Payer: Self-pay

## 2022-09-11 MED ORDER — ALPRAZOLAM 0.5 MG PO TABS
0.5000 mg | ORAL_TABLET | Freq: Every day | ORAL | 5 refills | Status: DC | PRN
  Filled 2022-09-11: qty 30, 30d supply, fill #0
  Filled 2022-11-13: qty 30, 30d supply, fill #1
  Filled 2023-01-09: qty 30, 30d supply, fill #2
  Filled 2023-02-13: qty 30, 30d supply, fill #3

## 2022-09-11 NOTE — Telephone Encounter (Signed)
Last refill: 04/11/22 #30, 0 Last OV: 06/15/22 dx. 6 month f/u HTN

## 2022-09-12 ENCOUNTER — Other Ambulatory Visit: Payer: Self-pay

## 2022-09-12 MED ORDER — METHOCARBAMOL 750 MG PO TABS
750.0000 mg | ORAL_TABLET | Freq: Three times a day (TID) | ORAL | 0 refills | Status: DC | PRN
  Filled 2022-09-12: qty 90, 30d supply, fill #0

## 2022-09-12 NOTE — Addendum Note (Signed)
Addended by: Asencion Partridge on: 09/12/2022 09:18 AM   Modules accepted: Orders

## 2022-09-12 NOTE — Telephone Encounter (Signed)
Please see pt response and advise 

## 2022-09-24 ENCOUNTER — Other Ambulatory Visit: Payer: Self-pay | Admitting: Family Medicine

## 2022-09-24 DIAGNOSIS — Z1231 Encounter for screening mammogram for malignant neoplasm of breast: Secondary | ICD-10-CM

## 2022-09-26 ENCOUNTER — Ambulatory Visit
Admission: RE | Admit: 2022-09-26 | Discharge: 2022-09-26 | Disposition: A | Discharge status: Home / Self Care | Payer: 59 | Source: Ambulatory Visit | Attending: Family Medicine | Admitting: Family Medicine

## 2022-09-26 DIAGNOSIS — Z1231 Encounter for screening mammogram for malignant neoplasm of breast: Secondary | ICD-10-CM

## 2022-10-02 ENCOUNTER — Other Ambulatory Visit (HOSPITAL_COMMUNITY): Payer: Self-pay

## 2022-10-02 DIAGNOSIS — M25562 Pain in left knee: Secondary | ICD-10-CM | POA: Diagnosis not present

## 2022-10-02 MED ORDER — MELOXICAM 15 MG PO TABS
15.0000 mg | ORAL_TABLET | Freq: Every day | ORAL | 1 refills | Status: DC | PRN
  Filled 2022-10-02: qty 30, 30d supply, fill #0
  Filled 2022-11-13: qty 30, 30d supply, fill #1

## 2022-11-07 DIAGNOSIS — Z6835 Body mass index (BMI) 35.0-35.9, adult: Secondary | ICD-10-CM | POA: Diagnosis not present

## 2022-11-07 DIAGNOSIS — M546 Pain in thoracic spine: Secondary | ICD-10-CM | POA: Diagnosis not present

## 2022-11-13 ENCOUNTER — Other Ambulatory Visit (HOSPITAL_COMMUNITY): Payer: Self-pay

## 2022-11-14 ENCOUNTER — Other Ambulatory Visit: Payer: Self-pay

## 2022-11-15 DIAGNOSIS — M5416 Radiculopathy, lumbar region: Secondary | ICD-10-CM | POA: Diagnosis not present

## 2022-11-19 ENCOUNTER — Other Ambulatory Visit (HOSPITAL_COMMUNITY): Payer: Self-pay

## 2022-11-19 ENCOUNTER — Encounter: Payer: Self-pay | Admitting: Family Medicine

## 2022-11-19 ENCOUNTER — Ambulatory Visit (INDEPENDENT_AMBULATORY_CARE_PROVIDER_SITE_OTHER): Payer: 59 | Admitting: Family Medicine

## 2022-11-19 VITALS — BP 136/88 | HR 77 | Temp 98.3°F | Ht 63.0 in | Wt 199.6 lb

## 2022-11-19 DIAGNOSIS — F5104 Psychophysiologic insomnia: Secondary | ICD-10-CM | POA: Diagnosis not present

## 2022-11-19 DIAGNOSIS — E538 Deficiency of other specified B group vitamins: Secondary | ICD-10-CM | POA: Diagnosis not present

## 2022-11-19 DIAGNOSIS — K760 Fatty (change of) liver, not elsewhere classified: Secondary | ICD-10-CM | POA: Diagnosis not present

## 2022-11-19 DIAGNOSIS — E782 Mixed hyperlipidemia: Secondary | ICD-10-CM | POA: Diagnosis not present

## 2022-11-19 DIAGNOSIS — K8020 Calculus of gallbladder without cholecystitis without obstruction: Secondary | ICD-10-CM | POA: Diagnosis not present

## 2022-11-19 DIAGNOSIS — E781 Pure hyperglyceridemia: Secondary | ICD-10-CM | POA: Diagnosis not present

## 2022-11-19 DIAGNOSIS — Z0001 Encounter for general adult medical examination with abnormal findings: Secondary | ICD-10-CM | POA: Diagnosis not present

## 2022-11-19 DIAGNOSIS — I2510 Atherosclerotic heart disease of native coronary artery without angina pectoris: Secondary | ICD-10-CM

## 2022-11-19 DIAGNOSIS — I471 Supraventricular tachycardia, unspecified: Secondary | ICD-10-CM

## 2022-11-19 DIAGNOSIS — Z9049 Acquired absence of other specified parts of digestive tract: Secondary | ICD-10-CM | POA: Diagnosis not present

## 2022-11-19 DIAGNOSIS — Z85038 Personal history of other malignant neoplasm of large intestine: Secondary | ICD-10-CM

## 2022-11-19 DIAGNOSIS — I10 Essential (primary) hypertension: Secondary | ICD-10-CM | POA: Diagnosis not present

## 2022-11-19 DIAGNOSIS — Z0000 Encounter for general adult medical examination without abnormal findings: Secondary | ICD-10-CM

## 2022-11-19 LAB — CBC WITH DIFFERENTIAL/PLATELET
Basophils Absolute: 0 10*3/uL (ref 0.0–0.1)
Basophils Relative: 0.9 % (ref 0.0–3.0)
Eosinophils Absolute: 0.2 10*3/uL (ref 0.0–0.7)
Eosinophils Relative: 3.6 % (ref 0.0–5.0)
HCT: 43 % (ref 36.0–46.0)
Hemoglobin: 14 g/dL (ref 12.0–15.0)
Lymphocytes Relative: 35.4 % (ref 12.0–46.0)
Lymphs Abs: 1.9 10*3/uL (ref 0.7–4.0)
MCHC: 32.5 g/dL (ref 30.0–36.0)
MCV: 89.8 fl (ref 78.0–100.0)
Monocytes Absolute: 0.3 10*3/uL (ref 0.1–1.0)
Monocytes Relative: 5.5 % (ref 3.0–12.0)
Neutro Abs: 3 10*3/uL (ref 1.4–7.7)
Neutrophils Relative %: 54.6 % (ref 43.0–77.0)
Platelets: 295 10*3/uL (ref 150.0–400.0)
RBC: 4.79 Mil/uL (ref 3.87–5.11)
RDW: 13 % (ref 11.5–15.5)
WBC: 5.4 10*3/uL (ref 4.0–10.5)

## 2022-11-19 LAB — COMPREHENSIVE METABOLIC PANEL
ALT: 32 U/L (ref 0–35)
AST: 24 U/L (ref 0–37)
Albumin: 4.5 g/dL (ref 3.5–5.2)
Alkaline Phosphatase: 89 U/L (ref 39–117)
BUN: 13 mg/dL (ref 6–23)
CO2: 25 mEq/L (ref 19–32)
Calcium: 9.6 mg/dL (ref 8.4–10.5)
Chloride: 104 mEq/L (ref 96–112)
Creatinine, Ser: 0.65 mg/dL (ref 0.40–1.20)
GFR: 98.99 mL/min (ref >60.00)
Glucose, Bld: 93 mg/dL (ref 70–99)
Potassium: 4.1 mEq/L (ref 3.5–5.1)
Sodium: 138 mEq/L (ref 135–145)
Total Bilirubin: 0.3 mg/dL (ref 0.2–1.2)
Total Protein: 6.6 g/dL (ref 6.0–8.3)

## 2022-11-19 LAB — LIPID PANEL
Cholesterol: 248 mg/dL — ABNORMAL HIGH (ref 0–200)
HDL: 51.3 mg/dL (ref >39.00)
NonHDL: 196.63
Total CHOL/HDL Ratio: 5
Triglycerides: 250 mg/dL — ABNORMAL HIGH (ref 0.0–149.0)
VLDL: 50 mg/dL — ABNORMAL HIGH (ref 0.0–40.0)

## 2022-11-19 LAB — TSH: TSH: 2.77 u[IU]/mL (ref 0.35–5.50)

## 2022-11-19 LAB — VITAMIN B12: Vitamin B-12: 1014 pg/mL — ABNORMAL HIGH (ref 211–911)

## 2022-11-19 LAB — LDL CHOLESTEROL, DIRECT: Direct LDL: 167 mg/dL

## 2022-11-19 LAB — VITAMIN D 25 HYDROXY (VIT D DEFICIENCY, FRACTURES): VITD: 71.52 ng/mL (ref 30.00–100.00)

## 2022-11-19 MED ORDER — CARVEDILOL 6.25 MG PO TABS
6.2500 mg | ORAL_TABLET | Freq: Two times a day (BID) | ORAL | 3 refills | Status: DC
  Filled 2022-11-19: qty 180, 90d supply, fill #0
  Filled 2023-01-09 – 2023-02-13 (×2): qty 180, 90d supply, fill #1

## 2022-11-19 MED ORDER — TRAZODONE HCL 50 MG PO TABS
25.0000 mg | ORAL_TABLET | Freq: Every evening | ORAL | 3 refills | Status: DC | PRN
Start: 2022-11-19 — End: 2023-05-14
  Filled 2022-11-19: qty 30, 30d supply, fill #0

## 2022-11-19 NOTE — Progress Notes (Signed)
Subjective  Chief Complaint  Patient presents with   Annual Exam    Pt here for Annual Exam and is currently fasting     HPI: Nicole Rosario is a 57 y.o. female who presents to [facility] [place] today for a Female Wellness Visit. She also has the concerns and/or needs as listed above in the chief complaint. These will be addressed in addition to the Health Maintenance Visit.   Wellness Visit: annual visit with health maintenance review and exam without Pap  HM: Reviewed colonoscopy surveillance every 3 year, last done June 2023.  Normal.  Done for history of colon cancer.  Follows at [facility] oncology annually.  Doing well.  Mammogram current, Pap smear current.  Both will be due again next year.  Immunizations up-to-date.  Busy active lifestyle. Chronic disease f/u and/or acute problem visit: (deemed necessary to be done in addition to the wellness visit): Blood pressure: Remains borderline high.  In March I was to increase carvedilol to 6.25 mg twice daily, but this did not get ordered.  Patient's home readings correlate with ours.  She feels well.  No chest pain or shortness of breath.  She has nonobstructive coronary disease.  Takes baby aspirin daily. History of PSVT on beta-blocker no current symptoms Fasting for lipid recheck.  Has history of hypertriglyceridemia which she takes fish oil capsules for.  Not on statin.  History of hepatic steatosis. On B12 1000 mcg every other day.  Due for recheck. Cholelithiasis, remains asymptomatic. Insomnia: Has been using Xanax 0.25 mg intermittently to help her sleep.  This started back when she was anxious and upset after losing her mother.  However her sleep problems persist.  They are mild per her description.  Mostly related to not being able to calm her mind.  No chronic mood or anxiety disorders.  Has not tried other somnolents outside of Benadryl.   Assessment  1. Annual physical exam   2. Essential hypertension   3. ASCVD  (arteriosclerotic cardiovascular disease)   4. PSVT (paroxysmal supraventricular tachycardia)   5. Hypertriglyceridemia   6. Mixed hyperlipidemia   7. Hepatic steatosis mild by ultrasound   8. H/O colon cancer, stage III   9. Hx of left hemicolectomy   10. Vitamin B12 deficiency   11. Calculus of gallbladder without cholecystitis without obstruction   12. Psychophysiological insomnia      Plan  Female Wellness Visit: Age appropriate Health Maintenance and Prevention measures were discussed with patient. Included topics are cancer screening recommendations, ways to keep healthy (see AVS) including dietary and exercise recommendations, regular eye and dental care, use of seat belts, and avoidance of moderate alcohol use and tobacco use.  Screens are current BMI: discussed patient's BMI and encouraged positive lifestyle modifications to help get to or maintain a target BMI. HM needs and immunizations were addressed and ordered. See below for orders. See HM and immunization section for updates. Routine labs and screening tests ordered including cmp, cbc and lipids where appropriate. Discussed recommendations regarding Vit D and calcium supplementation (see AVS)  Chronic disease management visit and/or acute problem visit: History of colon cancer with normal colonoscopy surveillance per [facility] oncology.  Reviewed Hypertension: Remains above goal.  Increase carvedilol to 6.25 mg twice daily.  Will also treat PSVT. Hyperlipidemia/hypertriglyceridemia: Recheck fasting lipids today.  Monitor levels.  Not yet on statin.  Will have low threshold to start. Nonobstructive cardiovascular disease on aspirin 81 mg daily.  No complications Psychophysiologic  insomnia: Counseling done.  Will change from Xanax to trazodone 0.25 to 50 mg nightly as needed.  Monitor for efficacy.  Stop Xanax if able. Recheck vitamin B12 levels on oral supplements every other day 1000 mcg Recommend low-fat diet.  Monitor for  symptoms of biliary disease.  Known cholelithiasis  Follow up: 6 months to recheck blood pressure Orders Placed This Encounter  Procedures   VITAMIN D 25 Hydroxy (Vit-D Deficiency, Fractures)   CBC with Differential/Platelet   Comprehensive metabolic panel   Lipid panel   TSH   Vitamin B12   Meds ordered this encounter  Medications   carvedilol (COREG) 6.25 MG tablet    Sig: Take 1 tablet (6.25 mg total) by mouth 2 (two) times daily.    Dispense:  180 tablet    Refill:  3   traZODone (DESYREL) 50 MG tablet    Sig: Take 0.5-1 tablets (25-50 mg total) by mouth at bedtime as needed for sleep.    Dispense:  30 tablet    Refill:  3      Body mass index is 35.36 kg/m. Wt Readings from Last 3 Encounters:  11/19/22 199 lb 9.6 oz (90.5 kg)  06/15/22 202 lb 12.8 oz (92 kg)  05/02/22 198 lb (89.8 kg)     Patient Active Problem List   Diagnosis Date Noted Date Diagnosed   Hypertriglyceridemia 06/15/2022     Priority: High    Fish oil capsules    ASCVD (arteriosclerotic cardiovascular disease) 03/23/2021     Priority: High    CAC score 7.2 2022; nl lipoprotein A, Cardiology eval. Started bb. Normal echocardiogram Cards eval neg with low calcium score and nl lipids, nl lipoprotein A. No cp  The 10-year ASCVD risk score (Arnett DK, et al., 2019) is: 3.8% and nl lipoprotein A. No statin indicated per cards       PSVT (paroxysmal supraventricular tachycardia) 03/23/2021     Priority: High   Essential hypertension 06/23/2020     Priority: High   Family history of premature CAD 04/22/2019     Priority: High   Obesity (BMI 30-39.9) 09/18/2017     Priority: High   H/O colon cancer, stage III 09/05/2014     Priority: High    Colonoscopy 11/28/2018; normal colon. 09/2021 normal: surveillance q 3 years with [facility]    Psychophysiological insomnia 11/19/2022     Priority: Medium     Started with loss of mother: xanax prn; transitioned to trazodone 8.2024.     Hepatic  steatosis mild by ultrasound 09/18/2017     Priority: Medium     Hepatic steatosis and family history of primary biliary cholangitis in mother. Her hepatologist recommends screening with mitochondrial antibodies every 2 years. 2021 done     Hx of left hemicolectomy 09/18/2017     Priority: Medium    Hx of microdiscectomy, L5-S1 09/18/2017     Priority: Medium    GERD (gastroesophageal reflux disease) 07/03/2012     Priority: Medium    Mixed hyperlipidemia 10/08/2011     Priority: Medium    Cholelithiasis 04/22/2019     Priority: Low    Asymptomatic. Incidental finding on ultrasound    Vitamin B12 deficiency 09/18/2017     Priority: Low   Seasonal allergies 10/08/2011     Priority: Low   Health Maintenance  Topic Date Due   COVID-19 Vaccine (6 - 2023-24 season) 12/05/2022 (Originally 06/12/2022)   INFLUENZA VACCINE  01/22/2023 (Originally 11/08/2022)   MAMMOGRAM  09/26/2023   PAP SMEAR-Modifier  11/27/2023   Colonoscopy  10/02/2024   DTaP/Tdap/Td (3 - Td or Tdap) 12/05/2027   Hepatitis C Screening  Completed   HIV Screening  Completed   Zoster Vaccines- Shingrix  Completed   HPV VACCINES  Aged Out   Immunization History  Administered Date(s) Administered   COVID-19, mRNA, vaccine(Comirnaty)12 years and older 04/17/2022   Hepatitis B 04/09/1993   Hepatitis B, ADULT 04/09/1993   Influenza,inj,Quad PF,6+ Mos 01/30/2021, 01/21/2022   Influenza-Unspecified 01/08/2015, 01/23/2018, 02/01/2019, 01/08/2020   MMR 04/09/1988   PFIZER Comirnaty(Gray Top)Covid-19 Tri-Sucrose Vaccine 10/11/2020   PFIZER(Purple Top)SARS-COV-2 Vaccination 04/04/2019, 04/22/2019, 01/29/2020   Td 12/04/2017   Tdap 04/10/2003   Zoster Recombinant(Shingrix) 06/23/2020, 12/14/2020   We updated and reviewed the patient's past history in detail and it is documented below. Allergies: Patient is allergic to erythromycin. Past Medical History Patient  has a past medical history of Cholelithiasis (04/22/2019),  Colon cancer Stage III at splenic flexure; followed at [facility]; s/p surgery and chemo. (04/09/2012), Fatty liver (09/18/2017), GERD (gastroesophageal reflux disease), Hyperlipidemia (10/08/2011), Obesity (BMI 30-39.9) (09/18/2017), Positive TB test, Seasonal allergies (10/08/2011), and Vasomotor symptoms due to menopause (09/18/2017). Past Surgical History Patient  has a past surgical history that includes Colon surgery; Microdiscectomy lumbar (2019); Hemicolectomy (2013); and Lumbar disc surgery. Family History: Patient family history includes Atrial fibrillation in her father and mother; Bladder Cancer in her father; Cancer in her paternal grandmother; Colon cancer in her maternal grandmother; Heart disease in her maternal aunt, maternal grandfather, and mother; Hypertension in her father; Lupus in her sister; Other in her mother; Parkinson's disease in her mother; Sick sinus syndrome in her mother. Social History:  Patient  reports that she has quit smoking. She has never used smokeless tobacco. She reports current alcohol use. She reports that she does not use drugs.  Review of Systems: Constitutional: negative for fever or malaise Ophthalmic: negative for photophobia, double vision or loss of vision Cardiovascular: negative for chest pain, dyspnea on exertion, or new LE swelling Respiratory: negative for SOB or persistent cough Gastrointestinal: negative for abdominal pain, change in bowel habits or melena Genitourinary: negative for dysuria or gross hematuria, no abnormal uterine bleeding or disharge Musculoskeletal: negative for new gait disturbance or muscular weakness Integumentary: negative for new or persistent rashes, no breast lumps Neurological: negative for TIA or stroke symptoms Psychiatric: negative for SI or delusions Allergic/Immunologic: negative for hives  Patient Care Team    Relationship Specialty Notifications Start End  Willow Ora, MD PCP - General Family Medicine  04/22/19    Thomasene Ripple, DO PCP - Cardiology Cardiology  03/23/21   Maeola Harman, MD Consulting Physician Neurosurgery  03/31/18   Jaymes Graff, MD Consulting Physician Obstetrics and Gynecology  03/31/18   Graylin Shiver, MD Consulting Physician Otolaryngology  05/16/18   Victorino Sparrow, PA-C Consulting Physician Hematology and Oncology  06/23/20   Rayann Heman, MD Consulting Physician Gastroenterology  12/15/21    Comment: [facility]  Delfin Gant, MD Consulting Physician Sports Medicine  11/19/22     Objective  Vitals: BP 136/88   Pulse 77   Temp 98.3 F (36.8 C)   Ht 5\' 3"  (1.6 m)   Wt 199 lb 9.6 oz (90.5 kg)   SpO2 98%   BMI 35.36 kg/m  General:  Well developed, well nourished, no acute distress  Psych:  Alert and orientedx3,normal mood and affect HEENT:  Normocephalic, atraumatic, non-icteric sclera,  supple neck without adenopathy,  mass or thyromegaly Cardiovascular:  Normal S1, S2, RRR without gallop, rub or murmur Respiratory:  Good breath sounds bilaterally, CTAB with normal respiratory effort Gastrointestinal: normal bowel sounds, soft, non-tender, no noted masses. No HSM MSK: extremities without edema, joints without erythema or swelling Neurologic:    Mental status is normal.  Gross motor and sensory exams are normal.  No tremor  Commons side effects, risks, benefits, and alternatives for medications and treatment plan prescribed today were discussed, and the patient expressed understanding of the given instructions. Patient is instructed to call or message via MyChart if he/she has any questions or concerns regarding our treatment plan. No barriers to understanding were identified. We discussed Red Flag symptoms and signs in detail. Patient expressed understanding regarding what to do in case of urgent or emergency type symptoms.  Medication list was reconciled, printed and provided to the patient in AVS. Patient instructions and summary information was reviewed with  the patient as documented in the AVS. This note was prepared with assistance of Dragon voice recognition software. Occasional wrong-word or sound-a-like substitutions may have occurred due to the inherent limitations of voice recognition software

## 2022-11-19 NOTE — Progress Notes (Signed)
Labs reviewed.  The 10-year ASCVD risk score (Arnett DK, et al., 2019) is: 4% HTN and + FH: recommended statin.   Dear Ms. Nicole Rosario, Thank you for allowing me to care for you at your recent office visit.  I wanted to let you know that I have reviewed your lab test results and am happy to report that they are mostly excellent.  Your blood counts, kidney and liver function, thyroid, and vit D levels are all perfect.  However, your cholesterol has worsened with high triglycerides and LDL. Given your risk factors, I think it is time to start taking a statin to lower these levels and your risk for heart disease. Please respond back if you would like me to order it. If you'd like to discuss further, we could have a visit for that. I recommend lipitor 10mg  nightly.  And finally, your vitamin B12 level is a little high now so can cut back to 3x/weekly on your supplements.  Please let me know if you have questions.  Sincerely, Dr. Mardelle Matte

## 2022-11-19 NOTE — Patient Instructions (Signed)
Please return in 6 months for hypertension follow up.   I will release your lab results to you on your MyChart account with further instructions. You may see the results before I do, but when I review them I will send you a message with my report or have my assistant call you if things need to be discussed. Please reply to my message with any questions. Thank you!   If you have any questions or concerns, please don't hesitate to send me a message via MyChart or call the office at [phone]. Thank you for visiting with [place] today! It's our pleasure caring for you.   Preventive Care 55-62 Years Old, Female Preventive care refers to lifestyle choices and visits with your health care provider that can promote health and wellness. Preventive care visits are also called wellness exams. What can I expect for my preventive care visit? Counseling Your health care provider may ask you questions about your: Medical history, including: Past medical problems. Family medical history. Pregnancy history. Current health, including: Menstrual cycle. Method of birth control. Emotional well-being. Home life and relationship well-being. Sexual activity and sexual health. Lifestyle, including: Alcohol, nicotine or tobacco, and drug use. Access to firearms. Diet, exercise, and sleep habits. Work and work Astronomer. Sunscreen use. Safety issues such as seatbelt and bike helmet use. Physical exam Your health care provider will check your: Height and weight. These may be used to calculate your BMI (body mass index). BMI is a measurement that tells if you are at a healthy weight. Waist circumference. This measures the distance around your waistline. This measurement also tells if you are at a healthy weight and may help predict your risk of certain diseases, such as type 2 diabetes and high blood pressure. Heart rate and blood pressure. Body temperature. Skin for abnormal spots. What immunizations do I  need?  Vaccines are usually given at various ages, according to a schedule. Your health care provider will recommend vaccines for you based on your age, medical history, and lifestyle or other factors, such as travel or where you work. What tests do I need? Screening Your health care provider may recommend screening tests for certain conditions. This may include: Lipid and cholesterol levels. Diabetes screening. This is done by checking your blood sugar (glucose) after you have not eaten for a while (fasting). Pelvic exam and Pap test. Hepatitis B test. Hepatitis C test. HIV (human immunodeficiency virus) test. STI (sexually transmitted infection) testing, if you are at risk. Lung cancer screening. Colorectal cancer screening. Mammogram. Talk with your health care provider about when you should start having regular mammograms. This may depend on whether you have a family history of breast cancer. BRCA-related cancer screening. This may be done if you have a family history of breast, ovarian, tubal, or peritoneal cancers. Bone density scan. This is done to screen for osteoporosis. Talk with your health care provider about your test results, treatment options, and if necessary, the need for more tests. Follow these instructions at home: Eating and drinking  Eat a diet that includes fresh fruits and vegetables, whole grains, lean protein, and low-fat dairy products. Take vitamin and mineral supplements as recommended by your health care provider. Do not drink alcohol if: Your health care provider tells you not to drink. You are pregnant, may be pregnant, or are planning to become pregnant. If you drink alcohol: Limit how much you have to 0-1 drink a day. Know how much alcohol is in your drink. In the [place]  one drink equals one 12 oz bottle of beer (355 mL), one 5 oz glass of wine (148 mL), or one 1 oz glass of hard liquor (44 mL). Lifestyle Brush your teeth every morning and night with  fluoride toothpaste. Floss one time each day. Exercise for at least 30 minutes 5 or more days each week. Do not use any products that contain nicotine or tobacco. These products include cigarettes, chewing tobacco, and vaping devices, such as e-cigarettes. If you need help quitting, ask your health care provider. Do not use drugs. If you are sexually active, practice safe sex. Use a condom or other form of protection to prevent STIs. If you do not wish to become pregnant, use a form of birth control. If you plan to become pregnant, see your health care provider for a prepregnancy visit. Take aspirin only as told by your health care provider. Make sure that you understand how much to take and what form to take. Work with your health care provider to find out whether it is safe and beneficial for you to take aspirin daily. Find healthy ways to manage stress, such as: Meditation, yoga, or listening to music. Journaling. Talking to a trusted person. Spending time with friends and family. Minimize exposure to UV radiation to reduce your risk of skin cancer. Safety Always wear your seat belt while driving or riding in a vehicle. Do not drive: If you have been drinking alcohol. Do not ride with someone who has been drinking. When you are tired or distracted. While texting. If you have been using any mind-altering substances or drugs. Wear a helmet and other protective equipment during sports activities. If you have firearms in your house, make sure you follow all gun safety procedures. Seek help if you have been physically or sexually abused. What's next? Visit your health care provider once a year for an annual wellness visit. Ask your health care provider how often you should have your eyes and teeth checked. Stay up to date on all vaccines. This information is not intended to replace advice given to you by your health care provider. Make sure you discuss any questions you have with your health  care provider. Document Revised: 09/21/2020 Document Reviewed: 09/21/2020 Elsevier Patient Education  2024 [employer].

## 2022-11-20 ENCOUNTER — Other Ambulatory Visit (HOSPITAL_COMMUNITY): Payer: Self-pay

## 2022-11-20 ENCOUNTER — Other Ambulatory Visit: Payer: Self-pay

## 2022-11-20 MED ORDER — ATORVASTATIN CALCIUM 10 MG PO TABS
10.0000 mg | ORAL_TABLET | Freq: Every evening | ORAL | 3 refills | Status: DC
  Filled 2022-11-20: qty 90, 90d supply, fill #0
  Filled 2023-01-09 – 2023-02-13 (×2): qty 90, 90d supply, fill #1
  Filled 2023-05-28: qty 90, 90d supply, fill #2
  Filled 2023-09-11: qty 90, 90d supply, fill #3

## 2023-01-09 ENCOUNTER — Other Ambulatory Visit: Payer: Self-pay

## 2023-01-09 ENCOUNTER — Other Ambulatory Visit (HOSPITAL_COMMUNITY): Payer: Self-pay

## 2023-01-16 ENCOUNTER — Encounter: Payer: Self-pay | Admitting: Podiatry

## 2023-01-16 ENCOUNTER — Ambulatory Visit (INDEPENDENT_AMBULATORY_CARE_PROVIDER_SITE_OTHER): Payer: 59 | Admitting: Podiatry

## 2023-01-16 DIAGNOSIS — L609 Nail disorder, unspecified: Secondary | ICD-10-CM | POA: Diagnosis not present

## 2023-01-16 DIAGNOSIS — B351 Tinea unguium: Secondary | ICD-10-CM | POA: Diagnosis not present

## 2023-01-16 NOTE — Progress Notes (Signed)
Subjective:  Patient ID: Nicole Rosario, female    DOB: 1966/03/13,   MRN: [id number]  No chief complaint on file.   57 y.o. female presents for concern of fungal nails that have been ongoing for about two years. Relates she has tried multiple over the counter treatments and has even been on penlac and not noticed much difference. Denis any pain.  Denies any other pedal complaints. Denies n/v/f/c.   Past Medical History:  Diagnosis Date   Cholelithiasis 04/22/2019   Colon cancer Stage III at splenic flexure; followed at [facility]; s/p surgery and chemo. 04/09/2012   Fatty liver 09/18/2017   GERD (gastroesophageal reflux disease)    Hyperlipidemia 10/08/2011   Obesity (BMI 30-39.9) 09/18/2017   Positive TB test    Seasonal allergies 10/08/2011   Vasomotor symptoms due to menopause 09/18/2017    Objective:  Physical Exam: Vascular: DP/PT pulses 2/4 bilateral. CFT <3 seconds. Normal hair growth on digits. No edema.  Skin. No lacerations or abrasions bilateral feet. Bilateral hallux nails with thickened discoloration and subungual debris noted mostly distal.  Musculoskeletal: MMT 5/5 bilateral lower extremities in DF, PF, Inversion and Eversion. Deceased ROM in DF of ankle joint.  Neurological: Sensation intact to light touch.   Assessment:   1. Onychomycosis      Plan:  Patient was evaluated and treated and all questions answered. -Examined patient -Discussed treatment options for painful dystrophic nails  -Clinical picture and Fungal culture was obtained by removing a portion of the hard nail itself from each of the involved toenails using a sterile nail nipper and sent to [facility] lab. Patient tolerated the biopsy procedure well without discomfort or need for anesthesia.  -Discussed fungal nail treatment options including oral, topical, and laser treatments.  -Patient to return in 4 weeks for follow up evaluation and discussion of fungal culture results or sooner if symptoms worsen.   Louann Sjogren, DPM

## 2023-01-16 NOTE — Addendum Note (Signed)
Addended by: Daryel November on: 01/16/2023 04:59 PM   Modules accepted: Orders

## 2023-02-13 ENCOUNTER — Other Ambulatory Visit: Payer: Self-pay

## 2023-02-13 ENCOUNTER — Other Ambulatory Visit (HOSPITAL_COMMUNITY): Payer: Self-pay

## 2023-02-13 ENCOUNTER — Ambulatory Visit (INDEPENDENT_AMBULATORY_CARE_PROVIDER_SITE_OTHER): Payer: 59 | Admitting: Podiatry

## 2023-02-13 DIAGNOSIS — L603 Nail dystrophy: Secondary | ICD-10-CM | POA: Diagnosis not present

## 2023-02-13 NOTE — Progress Notes (Signed)
  Subjective:  Patient ID: Nicole Rosario, female    DOB: May 15, 1965,   MRN: [id number]  No chief complaint on file.   57 y.o. female presents for follow-up of fungal nails and to go over culture results  Denis any pain.  Denies any other pedal complaints. Denies n/v/f/c.   Past Medical History:  Diagnosis Date   Cholelithiasis 04/22/2019   Colon cancer Stage III at splenic flexure; followed at [facility]; s/p surgery and chemo. 04/09/2012   Fatty liver 09/18/2017   GERD (gastroesophageal reflux disease)    Hyperlipidemia 10/08/2011   Obesity (BMI 30-39.9) 09/18/2017   Positive TB test    Seasonal allergies 10/08/2011   Vasomotor symptoms due to menopause 09/18/2017    Objective:  Physical Exam: Vascular: DP/PT pulses 2/4 bilateral. CFT <3 seconds. Normal hair growth on digits. No edema.  Skin. No lacerations or abrasions bilateral feet. Bilateral hallux nails with thickened discoloration and subungual debris noted mostly distal.  Musculoskeletal: MMT 5/5 bilateral lower extremities in DF, PF, Inversion and Eversion. Deceased ROM in DF of ankle joint.  Neurological: Sensation intact to light touch.   Assessment:   1. Onychodystrophy       Plan:  Patient was evaluated and treated and all questions answered. -Examined patient -Discussed treatment options for painful dystrophic nails  -Culture negative for fungus and relates trauma to the nail -Discussed dystrophic nail treatments including urea nail gel, vicks vaporub and even removal of nail   -Patient to return as needed   Louann Sjogren, DPM

## 2023-03-05 DIAGNOSIS — H40003 Preglaucoma, unspecified, bilateral: Secondary | ICD-10-CM | POA: Diagnosis not present

## 2023-03-31 ENCOUNTER — Encounter: Payer: Self-pay | Admitting: Family Medicine

## 2023-04-01 NOTE — Telephone Encounter (Signed)
See below

## 2023-05-08 ENCOUNTER — Encounter: Payer: Self-pay | Admitting: Cardiology

## 2023-05-08 ENCOUNTER — Other Ambulatory Visit: Payer: Self-pay

## 2023-05-08 ENCOUNTER — Other Ambulatory Visit (HOSPITAL_COMMUNITY): Payer: Self-pay

## 2023-05-08 ENCOUNTER — Ambulatory Visit: Payer: 59 | Attending: Cardiology | Admitting: Cardiology

## 2023-05-08 VITALS — BP 146/78 | HR 67 | Ht 64.0 in | Wt 204.6 lb

## 2023-05-08 DIAGNOSIS — I2510 Atherosclerotic heart disease of native coronary artery without angina pectoris: Secondary | ICD-10-CM

## 2023-05-08 DIAGNOSIS — I10 Essential (primary) hypertension: Secondary | ICD-10-CM

## 2023-05-08 DIAGNOSIS — E782 Mixed hyperlipidemia: Secondary | ICD-10-CM | POA: Diagnosis not present

## 2023-05-08 DIAGNOSIS — E781 Pure hyperglyceridemia: Secondary | ICD-10-CM | POA: Diagnosis not present

## 2023-05-08 DIAGNOSIS — E669 Obesity, unspecified: Secondary | ICD-10-CM | POA: Diagnosis not present

## 2023-05-08 MED ORDER — CARVEDILOL 12.5 MG PO TABS
12.5000 mg | ORAL_TABLET | Freq: Two times a day (BID) | ORAL | 3 refills | Status: DC
  Filled 2023-05-08: qty 180, 90d supply, fill #0

## 2023-05-08 NOTE — Patient Instructions (Signed)
Medication Instructions:  Your physician has recommended you make the following change in your medication:  INCREASE: Coreg 12.5 mg twice daily *If you need a refill on your cardiac medications before your next appointment, please call your pharmacy*   Lab Work: Lipids If you have labs (blood work) drawn today and your tests are completely normal, you will receive your results only by: MyChart Message (if you have MyChart) OR A paper copy in the mail If you have any lab test that is abnormal or we need to change your treatment, we will call you to review the results.   Follow-Up: At [facility], you and your health needs are our priority.  As part of our continuing mission to provide you with exceptional heart care, we have created designated Provider Care Teams.  These Care Teams include your primary Cardiologist (physician) and Advanced Practice Providers (APPs -  Physician Assistants and Nurse Practitioners) who all work together to provide you with the care you need, when you need it.   Your next appointment:   12-16 week(s)  Provider:   Thomasene Ripple, DO     Other Instructions Please see our pharmacy staff I 2 weeks.  Please bring your blood pressure cuff to your next appointment.

## 2023-05-08 NOTE — Progress Notes (Addendum)
Dr. Servando Salina and Leannah Guse has been identified as a patient that could benefit from health coaching for healthy eating and physical activity. Discuss with patient their interest in participating in the free health coaching program and refer to REF 2201/Care Navigation.  Renaee Munda, MS, ERHD, [facility]  Care Guide, Health & Wellness Coach [address], [address] [place] [address] Telephone: [phone] Email: [email or web address]

## 2023-05-08 NOTE — Progress Notes (Signed)
Cardiology Office Note:    Date:  05/08/2023   ID:  Nicole Rosario, DOB [birth year], MRN [id number]  PCP:  Willow Ora, MD  Cardiologist:  Thomasene Ripple, DO  Electrophysiologist:  None   Referring MD: Willow Ora, MD   " My blood pressure is higher since I last saw you"  History of Present Illness:    Nicole Rosario is a 58 y.o. female with a hx of hyperlipidemia, obesity, hypertension,  hyperlipidemia, hypertriglyceridemia, paroxysmal SVT  and coronary calcification here today for a follow up visit.   She reports that her blood pressure has been gradually increasing over the past six months, despite an increase in her Coreg dosage. The highest recorded blood pressure was in the mid-160s over low 90s, with a 30-day average of 149/88. The patient also reports experiencing more palpitations since the increase in Coreg dosage. She is compliant with her medication regimen and has been tracking her blood pressure at home.  In addition to hypertension, the patient has a history of hypertriglyceridemia. She was started on Lipitor approximately six months ago, but the effectiveness of this treatment has not been reassessed. The patient has no reported chest pain or shortness of breath.    Past Medical History:  Diagnosis Date   Cholelithiasis 04/22/2019   Colon cancer Stage III at splenic flexure; followed at [facility]; s/p surgery and chemo. 04/09/2012   Fatty liver 09/18/2017   GERD (gastroesophageal reflux disease)    Hyperlipidemia 10/08/2011   Obesity (BMI 30-39.9) 09/18/2017   Positive TB test    Seasonal allergies 10/08/2011   Vasomotor symptoms due to menopause 09/18/2017    Past Surgical History:  Procedure Laterality Date   COLON SURGERY     HEMICOLECTOMY  2013   LUMBAR DISC SURGERY     MICRODISCECTOMY LUMBAR  2019    Current Medications: Current Meds  Medication Sig   ALPRAZolam (XANAX) 0.5 MG tablet Take 1 tablet (0.5 mg total) by mouth daily as needed for anxiety.   aspirin EC  81 MG tablet Take 1 tablet by mouth daily.   atorvastatin (LIPITOR) 10 MG tablet Take 1 tablet (10 mg total) by mouth at bedtime.   carvedilol (COREG) 12.5 MG tablet Take 1 tablet (12.5 mg total) by mouth 2 (two) times daily.   Cholecalciferol (VITAMIN D3) 125 MCG (5000 UT) TABS Take 5,000 Units by mouth daily.   cyanocobalamin (VITAMIN B12) 1000 MCG tablet Take 1,000 mcg by mouth daily.   ferrous sulfate (SLOW RELEASE IRON) 160 (50 Fe) MG TBCR SR tablet Take 160 mg by mouth daily.   methocarbamol (ROBAXIN-750) 750 MG tablet Take 1 tablet (750 mg total) by mouth 3 (three) times daily as needed for muscle spasms.   Omega-3 Fatty Acids (FISH OIL) 1000 MG CPDR Take 1 tablet by mouth daily.   [DISCONTINUED] carvedilol (COREG) 6.25 MG tablet Take 1 tablet (6.25 mg) by mouth 2 times daily.     Allergies:   Erythromycin   Social History   Socioeconomic History   Marital status: Married    Spouse name: Gaylyn Rong   Number of children: 0   Years of education: Not on file   Highest education level: Associate degree: occupational, Scientist, product/process development, or vocational program  Occupational History   Occupation: Teacher, adult education: [employer]    Comment: [employer]  Tobacco Use   Smoking status: Former   Smokeless tobacco: Never  Advertising account planner   Vaping status: Never Used  Substance and Sexual  Activity   Alcohol use: Yes    Alcohol/week: 0.0 standard drinks of alcohol    Comment: social   Drug use: Never   Sexual activity: Yes    Birth control/protection: Post-menopausal  Other Topics Concern   Not on file  Social History Narrative   Not on file   Social Drivers of Health   Financial Resource Strain: Not on file  Food Insecurity: Not on file  Transportation Needs: Not on file  Physical Activity: Not on file  Stress: Not on file  Social Connections: Not on file     Family History: The patient's family history includes Atrial fibrillation in her father and mother; Bladder Cancer in her father; Cancer in  her paternal grandmother; Colon cancer in her maternal grandmother; Heart disease in her maternal aunt, maternal grandfather, and mother; Hypertension in her father; Lupus in her sister; Other in her mother; Parkinson's disease in her mother; Sick sinus syndrome in her mother.  ROS:   Review of Systems  Constitution: Negative for decreased appetite, fever and weight gain.  HENT: Negative for congestion, ear discharge, hoarse voice and sore throat.   Eyes: Negative for discharge, redness, vision loss in right eye and visual halos.  Cardiovascular: Negative for chest pain, dyspnea on exertion, leg swelling, orthopnea and palpitations.  Respiratory: Negative for cough, hemoptysis, shortness of breath and snoring.   Endocrine: Negative for heat intolerance and polyphagia.  Hematologic/Lymphatic: Negative for bleeding problem. Does not bruise/bleed easily.  Skin: Negative for flushing, nail changes, rash and suspicious lesions.  Musculoskeletal: Negative for arthritis, joint pain, muscle cramps, myalgias, neck pain and stiffness.  Gastrointestinal: Negative for abdominal pain, bowel incontinence, diarrhea and excessive appetite.  Genitourinary: Negative for decreased libido, genital sores and incomplete emptying.  Neurological: Negative for brief paralysis, focal weakness, headaches and loss of balance.  Psychiatric/Behavioral: Negative for altered mental status, depression and suicidal ideas.  Allergic/Immunologic: Negative for HIV exposure and persistent infections.    EKGs/Labs/Other Studies Reviewed:    The following studies were reviewed today:   EKG:  The ekg ordered today demonstrates sinus rhythm, heart 67 bpm with incomplete rbbb, old anteroseptal infarction and t-wave inversion in the anterior leads - compared to prior EKG t wave inversion is new  Recent Labs: 11/19/2022: ALT 32; BUN 13; Creatinine, Ser 0.65; Hemoglobin 14.0; Platelets 295.0; Potassium 4.1; Sodium 138; TSH 2.77   Recent Lipid Panel    Component Value Date/Time   CHOL 248 (H) 11/19/2022 1015   TRIG 250.0 (H) 11/19/2022 1015   HDL 51.30 11/19/2022 1015   CHOLHDL 5 11/19/2022 1015   VLDL 50.0 (H) 11/19/2022 1015   LDLCALC 161 (H) 11/23/2019 0958   LDLDIRECT 167.0 11/19/2022 1015    Physical Exam:    VS:  BP (!) 146/78   Pulse 67   Ht 5\' 4"  (1.626 m)   Wt 204 lb 9.6 oz (92.8 kg)   SpO2 93%   BMI 35.12 kg/m     Wt Readings from Last 3 Encounters:  05/08/23 204 lb 9.6 oz (92.8 kg)  11/19/22 199 lb 9.6 oz (90.5 kg)  06/15/22 202 lb 12.8 oz (92 kg)     GEN: Well nourished, well developed in no acute distress HEENT: Normal NECK: No JVD; No carotid bruits LYMPHATICS: No lymphadenopathy CARDIAC: S1S2 noted,RRR, no murmurs, rubs, gallops RESPIRATORY:  Clear to auscultation without rales, wheezing or rhonchi  ABDOMEN: Soft, non-tender, non-distended, +bowel sounds, no guarding. EXTREMITIES: No edema, No cyanosis, no clubbing MUSCULOSKELETAL:  No  deformity  SKIN: Warm and dry NEUROLOGIC:  Alert and oriented x 3, non-focal PSYCHIATRIC:  Normal affect, good insight  ASSESSMENT:    1. Essential hypertension   2. Hypertriglyceridemia   3. Mixed hyperlipidemia   4. Coronary artery calcification seen on CAT scan   5. Obesity (BMI 30-39.9)    PLAN:    Hypertension Elevated blood pressure readings with an average of 149/88 despite increased dose of Coreg. Noted increased palpitations with increased dose of Coreg. -Increase Coreg to 12.5mg  twice daily. -Check blood pressure in 2 weeks with a pharmacist and adjust medication as needed. -Return visit in 12-16 weeks to reassess blood pressure control.  Hypertriglyceridemia/Hyperlipidemia  Elevated triglycerides noted on previous blood work. Started on Lipitor but no follow-up labs have been done. -Draw lipid panel today to assess response to Lipitor. -Adjust lipid lowering medication as needed based on lab results. If triglycerides remain  high would consider fenofibrate. LDL goal less than 70, primary prevention   Coronary Artery calcification. Presence of coronary artery calcium noted on previous coronary Calcium scoring. -Ensure LDL is less than 70 and triglycerides are less than 150 to prevent further build-up. -Adjust lipid-lowering therapy as needed based on lab results.  New findings on EKG - no concerning symptoms. Will monitor closely.  Life style modification - would consider futher management would benefit from our health coach evaluatio .  The patient is in agreement with the above plan. The patient left the office in stable condition.  The patient will follow up in   Medication Adjustments/Labs and Tests Ordered: Current medicines are reviewed at length with the patient today.  Concerns regarding medicines are outlined above.  Orders Placed This Encounter  Procedures   Lipid panel   AMB Referral to [facility] Pharm-D   EKG 12-Lead   Meds ordered this encounter  Medications   carvedilol (COREG) 12.5 MG tablet    Sig: Take 1 tablet (12.5 mg total) by mouth 2 (two) times daily.    Dispense:  180 tablet    Refill:  3    Patient Instructions  Medication Instructions:  Your physician has recommended you make the following change in your medication:  INCREASE: Coreg 12.5 mg twice daily *If you need a refill on your cardiac medications before your next appointment, please call your pharmacy*   Lab Work: Lipids If you have labs (blood work) drawn today and your tests are completely normal, you will receive your results only by: MyChart Message (if you have MyChart) OR A paper copy in the mail If you have any lab test that is abnormal or we need to change your treatment, we will call you to review the results.   Follow-Up: At [facility], you and your health needs are our priority.  As part of our continuing mission to provide you with exceptional heart care, we have created designated  Provider Care Teams.  These Care Teams include your primary Cardiologist (physician) and Advanced Practice Providers (APPs -  Physician Assistants and Nurse Practitioners) who all work together to provide you with the care you need, when you need it.   Your next appointment:   12-16 week(s)  Provider:   Thomasene Ripple, DO     Other Instructions Please see our pharmacy staff I 2 weeks.  Please bring your blood pressure cuff to your next appointment.          Adopting a Healthy Lifestyle.  Know what a healthy weight is for you (roughly BMI <25)  and aim to maintain this   Aim for 7+ servings of fruits and vegetables daily   65-80+ fluid ounces of water or unsweet tea for healthy kidneys   Limit to max 1 drink of alcohol per day; avoid smoking/tobacco   Limit animal fats in diet for cholesterol and heart health - choose grass fed whenever available   Avoid highly processed foods, and foods high in saturated/trans fats   Aim for low stress - take time to unwind and care for your mental health   Aim for 150 min of moderate intensity exercise weekly for heart health, and weights twice weekly for bone health   Aim for 7-9 hours of sleep daily   When it comes to diets, agreement about the perfect plan isnt easy to find, even among the experts. Experts at the [facility] [employer] developed an idea known as the Healthy Eating Plate. Just imagine a plate divided into logical, healthy portions.   The emphasis is on diet quality:   Load up on vegetables and fruits - one-half of your plate: Aim for color and variety, and remember that potatoes dont count.   Go for whole grains - one-quarter of your plate: Whole wheat, barley, wheat berries, quinoa, oats, brown rice, and foods made with them. If you want pasta, go with whole wheat pasta.   Protein power - one-quarter of your plate: Fish, chicken, beans, and nuts are all healthy, versatile protein sources. Limit red meat.    The diet, however, does go beyond the plate, offering a few other suggestions.   Use healthy plant oils, such as olive, canola, soy, corn, sunflower and peanut. Check the labels, and avoid partially hydrogenated oil, which have unhealthy trans fats.   If youre thirsty, drink water. Coffee and tea are good in moderation, but skip sugary drinks and limit milk and dairy products to one or two daily servings.   The type of carbohydrate in the diet is more important than the amount. Some sources of carbohydrates, such as vegetables, fruits, whole grains, and beans-are healthier than others.   Finally, stay active  Signed, Thomasene Ripple, DO  05/08/2023 8:36 PM    [facility]

## 2023-05-09 ENCOUNTER — Encounter: Payer: Self-pay | Admitting: Cardiology

## 2023-05-09 LAB — LIPID PANEL
Chol/HDL Ratio: 3.6 {ratio} (ref 0.0–4.4)
Cholesterol, Total: 166 mg/dL (ref 100–199)
HDL: 46 mg/dL (ref >39)
LDL Chol Calc (NIH): 83 mg/dL (ref 0–99)
Triglycerides: 223 mg/dL — ABNORMAL HIGH (ref 0–149)
VLDL Cholesterol Cal: 37 mg/dL (ref 5–40)

## 2023-05-14 ENCOUNTER — Ambulatory Visit (HOSPITAL_BASED_OUTPATIENT_CLINIC_OR_DEPARTMENT_OTHER): Payer: 59 | Admitting: Pharmacist Clinician (PhC)/ Clinical Pharmacy Specialist

## 2023-05-14 ENCOUNTER — Telehealth: Payer: Self-pay

## 2023-05-14 ENCOUNTER — Encounter: Payer: Self-pay | Admitting: Cardiology

## 2023-05-14 ENCOUNTER — Encounter (HOSPITAL_BASED_OUTPATIENT_CLINIC_OR_DEPARTMENT_OTHER): Payer: Self-pay | Admitting: Pharmacist Clinician (PhC)/ Clinical Pharmacy Specialist

## 2023-05-14 VITALS — BP 133/87 | HR 68

## 2023-05-14 DIAGNOSIS — I10 Essential (primary) hypertension: Secondary | ICD-10-CM

## 2023-05-14 DIAGNOSIS — E781 Pure hyperglyceridemia: Secondary | ICD-10-CM

## 2023-05-14 DIAGNOSIS — Z0000 Encounter for general adult medical examination without abnormal findings: Secondary | ICD-10-CM

## 2023-05-14 NOTE — Progress Notes (Signed)
 Office Visit    Patient Name: KRYSTA BLOOMFIELD Date of Encounter: 05/14/2023  Primary Care Provider:  Jodie Lavern LITTIE, MD Primary Cardiologist:  Kardie Tobb, DO  Chief Complaint    Hypertension  Significant Past Medical History   CAD CAC = 7.02 (82nd percentile)  HLD LDL 82, TG 223; on atorvastatin  10  Paroxysmal SVT   obesity BMI 35.1       Allergies  Allergen Reactions   Erythromycin Nausea Only and Nausea And Vomiting    History of Present Illness    Mariajose L Laden is a 58 y.o. female patient of Dr Sheena, in the office today for hypertension evaluation.  She was seen last week by Dr. Sheena, with a BP of 146/78, although she noted home readings had been gradually increasing, as high as mid 160's systolic and low 09'd diastolic.  Dr. Sheena increased the carvedilol  to 12.5 mg twice daily  Today she returns for follow up.  Feeling a bit sluggish with the increase of carvedilol .  I encouraged her to give it another week and she should feel like herself again.     Blood Pressure Goal:  130/80  Current Medications:  carvedilol  12.5 mg bid  Family Hx: both parents had hypertension, neither died from heart related issues; sister w/o htn, no kids  Social Hx:      Tobacco: no  Alcohol: rarely  Caffeine: half caffeine coffee in the morning, otherwise decaf  Diet:    has gone to [employer], now whole grain breads; no sodas, quit snacking; fish and chicken for protein; fruits/veggies usually fresh.  Did lose 7 pounds in the last week since quitting soda.   Exercise: walks dogs daily (3 small dogs)  Home BP readings:  130's/low 80's, highest diastolic 90, no systolic > 140  Home ihealth device, average for past 30 days 134/84  Home device new in the past year  152/93  66    Accessory Clinical Findings    Lab Results  Component Value Date   CREATININE 0.65 11/19/2022   BUN 13 11/19/2022   NA 138 11/19/2022   K 4.1 11/19/2022   CL 104 11/19/2022   CO2 25 11/19/2022    Lab Results  Component Value Date   ALT 32 11/19/2022   AST 24 11/19/2022   ALKPHOS 89 11/19/2022   BILITOT 0.3 11/19/2022   Lab Results  Component Value Date   HGBA1C 5.6 03/27/2021    Home Medications    Current Outpatient Medications  Medication Sig Dispense Refill   ALPRAZolam  (XANAX ) 0.5 MG tablet Take 1 tablet (0.5 mg total) by mouth daily as needed for anxiety. 30 tablet 5   aspirin EC 81 MG tablet Take 1 tablet by mouth daily.     atorvastatin  (LIPITOR) 10 MG tablet Take 1 tablet (10 mg total) by mouth at bedtime. 90 tablet 3   CALCIUM -MAGNESIUM-ZINC PO Take 1 tablet by mouth daily.     carvedilol  (COREG ) 12.5 MG tablet Take 1 tablet (12.5 mg total) by mouth 2 (two) times daily. 180 tablet 3   Cholecalciferol (VITAMIN D3) 125 MCG (5000 UT) TABS Take 5,000 Units by mouth daily.     cyanocobalamin  (VITAMIN B12) 1000 MCG tablet Take 1,000 mcg by mouth 2 (two) times a week.     ferrous sulfate (SLOW RELEASE IRON) 160 (50 Fe) MG TBCR SR tablet Take 160 mg by mouth daily.     methocarbamol  (ROBAXIN -750) 750 MG tablet Take 1 tablet (750 mg  total) by mouth 3 (three) times daily as needed for muscle spasms. 90 tablet 0   Omega-3 Fatty Acids (FISH OIL) 1000 MG CPDR Take 1 tablet by mouth daily.     No current facility-administered medications for this visit.         Assessment & Plan    Essential hypertension Assessment: BP is uncontrolled in office BP 133/87 mmHg;  above the goal (<130/80). Home device read 10-12 points higher, but average matched in office reading Tolerates carcedilol well without any side effects Denies SOB, palpitation, chest pain, headaches,or swelling Reiterated the importance of regular exercise and low salt diet   Plan:  Continue taking carvedilol  12.5 mg bid Continue to work on dietary changes/lifestyle modifications. Patient to keep record of BP readings with heart rate and report to us  at the next visit Patient to follow up with PharmD in 6  weeks Referral to Greig Ruth, Health Coach  Labs ordered today: none   Allean Mink PharmD CPP [facility]  [address] [place], [place] [address] [phone]

## 2023-05-14 NOTE — Patient Instructions (Addendum)
 Follow up appointment: Wednesday March 19 at 9:30 am  Take your BP meds as follows: continue with carvedilol  12.5 mg twice daily  I will send a referral to Greig Ruth, our HealthCoach to reach out to you.    Check your blood pressure at home daily and keep record of the readings.  Your blood pressure goal is < 130/80  To check your pressure at home you will need to:  1. Sit up in a chair, with feet flat on the floor and back supported. Do not cross your ankles or legs. 2. Rest your left arm so that the cuff is about heart level. If the cuff goes on your upper arm,  then just relax the arm on the table, arm of the chair or your lap. If you have a wrist cuff, we  suggest relaxing your wrist against your chest (think of it as Pledging the Flag with the  wrong arm).  3. Place the cuff snugly around your arm, about 1 inch above the crook of your elbow. The  cords should be inside the groove of your elbow.  4. Sit quietly, with the cuff in place, for about 5 minutes. After that 5 minutes press the power  button to start a reading. 5. Do not talk or move while the reading is taking place.  6. Record your readings on a sheet of paper. Although most cuffs have a memory, it is often  easier to see a pattern developing when the numbers are all in front of you.  7. You can repeat the reading after 1-3 minutes if it is recommended  Make sure your bladder is empty and you have not had caffeine or tobacco within the last 30 min  Always bring your blood pressure log with you to your appointments. If you have not brought your monitor in to be double checked for accuracy, please bring it to your next appointment.  You can find a list of quality blood pressure cuffs at [email or web address]  Important lifestyle changes to control high blood pressure  Intervention  Effect on the BP  Lose extra pounds and watch your waistline Weight loss is one of the most effective lifestyle changes for controlling blood  pressure. If you're overweight or obese, losing even a small amount of weight can help reduce blood pressure. Blood pressure might go down by about 1 millimeter of mercury (mm Hg) with each kilogram (about 2.2 pounds) of weight lost.  Exercise regularly As a general goal, aim for at least 30 minutes of moderate physical activity every day. Regular physical activity can lower high blood pressure by about 5 to 8 mm Hg.  Eat a healthy diet Eating a diet rich in whole grains, fruits, vegetables, and low-fat dairy products and low in saturated fat and cholesterol. A healthy diet can lower high blood pressure by up to 11 mm Hg.  Reduce salt (sodium) in your diet Even a small reduction of sodium in the diet can improve heart health and reduce high blood pressure by about 5 to 6 mm Hg.  Limit alcohol One drink equals 12 ounces of beer, 5 ounces of wine, or 1.5 ounces of 80-proof liquor.  Limiting alcohol to less than one drink a day for women or two drinks a day for men can help lower blood pressure by about 4 mm Hg.   If you have any questions or concerns please use My Chart to send questions or call the office at [phone]

## 2023-05-14 NOTE — Assessment & Plan Note (Signed)
 Assessment: BP is uncontrolled in office BP 133/87 mmHg;  above the goal (<130/80). Home device read 10-12 points higher, but average matched in office reading Tolerates carcedilol well without any side effects Denies SOB, palpitation, chest pain, headaches,or swelling Reiterated the importance of regular exercise and low salt diet   Plan:  Continue taking carvedilol  12.5 mg bid Continue to work on dietary changes/lifestyle modifications. Patient to keep record of BP readings with heart rate and report to us  at the next visit Patient to follow up with PharmD in 6 weeks Referral to Greig Ruth, Health Coach  Labs ordered today: none

## 2023-05-14 NOTE — Telephone Encounter (Signed)
 Called patient per health coaching referral for setting health goals from Kristin Alvstad. Patient did not answer. Left message for patient to return call.SABRA Greig Ruth, MS, ERHD, [facility]  Care Guide, Health & Wellness Coach [address] [place] [address] Telephone: [phone] Email: [email or web address]

## 2023-05-15 ENCOUNTER — Telehealth: Payer: Self-pay

## 2023-05-15 DIAGNOSIS — Z0000 Encounter for general adult medical examination without abnormal findings: Secondary | ICD-10-CM

## 2023-05-15 NOTE — Telephone Encounter (Signed)
 Returned patient's call regarding her interest in participating in health coaching. Patient has been scheduled for her initial health coaching session over the phone on 2/12 at 2:00pm. Patient will be called at this time.    Greig Ruth, MS, ERHD, [facility]  Care Guide, Health & Wellness Coach [address] [place] [address] Telephone: [phone] Email: [email or web address]

## 2023-05-22 ENCOUNTER — Ambulatory Visit (HOSPITAL_BASED_OUTPATIENT_CLINIC_OR_DEPARTMENT_OTHER): Payer: 59

## 2023-05-22 ENCOUNTER — Telehealth (HOSPITAL_BASED_OUTPATIENT_CLINIC_OR_DEPARTMENT_OTHER): Payer: Self-pay | Admitting: Licensed Clinical Social Worker

## 2023-05-22 ENCOUNTER — Encounter (HOSPITAL_BASED_OUTPATIENT_CLINIC_OR_DEPARTMENT_OTHER): Payer: Self-pay

## 2023-05-22 NOTE — Telephone Encounter (Signed)
CSW contacted patient to inform that Nicole Rosario is out of the office unexpectedly and will need to cancel visit. Lasandra Beech, LCSW, CCSW-MCS [phone]

## 2023-05-23 ENCOUNTER — Ambulatory Visit: Payer: 59 | Admitting: Family Medicine

## 2023-05-24 ENCOUNTER — Encounter: Payer: Self-pay | Admitting: Pharmacist Clinician (PhC)/ Clinical Pharmacy Specialist

## 2023-05-27 ENCOUNTER — Other Ambulatory Visit: Payer: Self-pay

## 2023-05-27 MED ORDER — AMLODIPINE BESYLATE 5 MG PO TABS
5.0000 mg | ORAL_TABLET | Freq: Every day | ORAL | 3 refills | Status: DC
  Filled 2023-05-27: qty 90, 90d supply, fill #0

## 2023-05-27 MED ORDER — CARVEDILOL 6.25 MG PO TABS
6.2500 mg | ORAL_TABLET | Freq: Two times a day (BID) | ORAL | 3 refills | Status: AC
  Filled 2023-05-27: qty 180, 90d supply, fill #0
  Filled 2023-08-20: qty 180, 90d supply, fill #1
  Filled 2023-12-03: qty 180, 90d supply, fill #2
  Filled 2024-03-26: qty 180, 90d supply, fill #3

## 2023-05-28 ENCOUNTER — Other Ambulatory Visit (HOSPITAL_COMMUNITY): Payer: Self-pay

## 2023-06-05 ENCOUNTER — Telehealth: Payer: Self-pay

## 2023-06-05 DIAGNOSIS — Z0000 Encounter for general adult medical examination without abnormal findings: Secondary | ICD-10-CM

## 2023-06-05 NOTE — Telephone Encounter (Signed)
 Called patient to discuss rescheduling health coaching appointment. Patient has been rescheduled for 2/28 @ 1pm per her request over the phone.   Renaee Munda, MS, ERHD, [facility]  Care Guide, Health & Wellness Coach [address], [address] [place] [address] Telephone: [phone] Email: [email or web address]

## 2023-06-07 ENCOUNTER — Ambulatory Visit: Payer: Self-pay | Attending: Internal Medicine

## 2023-06-07 DIAGNOSIS — Z0000 Encounter for general adult medical examination without abnormal findings: Secondary | ICD-10-CM

## 2023-06-07 NOTE — Progress Notes (Signed)
 HEALTH & WELLNESS COACHING INITIAL INTAKE   Appointment Outcome: Completed, Session #: Initial                         Start time: 1:00pm   End time: 2:02pm   Total Mins: 62 minutes   What are the Patient's goals from Coaching? Improve healthy eating habits and increase exercise to lower triglycerides, blood pressure, and lose 1.5 pounds per week over the next 12 weeks.  Why did they seek coaching now? Patient has started making changes with eating habits and exercise to lose weight but want to focus more specifically on changes to lower triglycerides and blood pressure.  Readiness - What stage is the patient in regarding their goal(s)? Patient is in the action stage of improving healthy eating habits and increasing exercise.   Coaching Progress Notes: Patient has switched to the mediterranean diet, eats one meal per day, snack for lunch, and juices fruit and green vegetables for breakfast.  Stopped drinking soft drinks, consuming refined sugars, processed foods.  Eat fresh foods.  Use olive oil and light butter for cooking.  Reading food labels. Patient is not carb counting.  Using MyFitnessPal app to track calories and set to lose 1.5 pounds per week.  Aiming to drink 75 ounces of water per day but averaging 55-60 ounces.  Patient is exercising 5-6 days per week by using the treadmill and using free weights for resistance training.  Using iFit with treadmill to guide exercise routine. Takes 20-24 minutes to complete on average depending on hike and incline with 2.5-3 speed.  Patient reported that she has lost 11 pounds since her visit with Dr. Servando Salina after implementing these changes.  Challenge in past has been hitting a plateau with weight lost is from earning extra calories over time.    Coaching Outcomes Patient feels that she is comfortable with her exercise routine at this time and will evaluate necessary changes when she reaches a plateau.  Discussed with patient how  her current healthy eating habits are helping her work towards achieving her overall goal.  Patient will determine if she wants to add additional action steps to her current healthy eating habits prior to her next health coaching appointment.  Will define health coaching agreement more during next appointment.    AGREEMENTS SECTION   Overall Goal(s): Improve healthy eating habits and increase exercise to lower triglycerides, blood pressure, and lose 1.5 pounds per week over the next 12 weeks.   Agreement/Action Steps:  Improve healthy eating habits Follow the mediterranean diet/fresh foods Eat one meal per day Juice for breakfast Healthy snack for lunch No soft drinks No refined sugars/sugary processed foods   Increase exercise Exercise at least 5-6 days per week Walk on treadmill for 20-24 minutes at 2.5-3 Lift free weights   Patient review of Health Coaching Agreement Reviewed Coaching Agreement and Code of Ethics with Patient during initial session. Answered any questions the patient had if any regarding the Coaching Agreement and Code of Ethics. Patient verbally agreed to adhere to the Coaching Agreement and to abide by the Code of Ethics.  Mailed patient with a hard/electronic copy of the Coaching Agreement and Code of Ethics.   Referrals: N/A  Resources: Patient was emailed links to the American Heart Association on lowering cholesterol/triglycerides, cholesterol content in food, choosing healthy fats, and reading food labels to monitor cholesterol and saturated/trans fats.

## 2023-06-10 DIAGNOSIS — C185 Malignant neoplasm of splenic flexure: Secondary | ICD-10-CM | POA: Diagnosis not present

## 2023-06-21 ENCOUNTER — Telehealth: Payer: Self-pay

## 2023-06-21 ENCOUNTER — Ambulatory Visit: Payer: Self-pay

## 2023-06-21 DIAGNOSIS — Z0000 Encounter for general adult medical examination without abnormal findings: Secondary | ICD-10-CM

## 2023-06-21 NOTE — Telephone Encounter (Signed)
 Patient called in because she is having a repair completed at her home at this time and wanted to reschedule her appointment due to the noise. Patient has been rescheduled for her telephonic health coaching appointment on 06/25/23 at 11:00am.   Renaee Munda, MS, ERHD, [facility]  Care Guide, Health & Wellness Coach [address] [place] [address] Telephone: [phone] Email: [email or web address]

## 2023-06-24 NOTE — Progress Notes (Signed)
 Patient sent message requesting health coaching appointment be rescheduled from 3/18 at 11am to 12pm due to conflict in schedule. Patient has been rescheduled as requested per telephonic health coaching appointment.   Renaee Munda, MS, ERHD, [facility]  Care Guide, Health & Wellness Coach [address], [address] [place] [address] Telephone: [phone] Email: [email or web address]

## 2023-06-25 ENCOUNTER — Ambulatory Visit: Attending: Cardiovascular Disease

## 2023-06-25 DIAGNOSIS — Z0000 Encounter for general adult medical examination without abnormal findings: Secondary | ICD-10-CM

## 2023-06-25 NOTE — Progress Notes (Signed)
 Appointment Outcome: Completed, Session #: 1                        Start time: 12:00pm   End time: 12:25pm   Total Mins: 25 minutes  AGREEMENTS SECTION   Overall Goal(s): Improve healthy eating habits and increase exercise to lower triglycerides, blood pressure, and lose 1.5 pounds per week over the next 12 weeks.     Agreement/Action Steps:  Improve healthy eating habits Follow the Mediterranean diet/fresh foods Eat one meal per day Juice for breakfast Healthy snack for lunch No soft drinks No refined sugars/sugary processed foods     Increase exercise Exercise at least 5-6 days per week Walk on treadmill for 20-24 minutes at 2.5-3 Lift free weights    Progress Notes:  Patient stated that she was not able to work towards increasing her exercise over the past two weeks due to competing priorities that lead to having workers in her home all last week, her caring for a sick pet, and other home damages. Patient mentioned that being in a sadden mental/emotional state diminished her motivation to exercise more so than her schedule.   Patient was able to maintain her normal Mediterranean diet over the past two weeks with the exception of one day of having fast food for a late lunch/dinner around 3pm due to a time crunch. Patient stated that otherwise, she was able to maintain making her own healthy snacks such as no sugar added granola, and seed crackers. Patient mentioned that she has included steel cut oats in her diet, and continued to eat chicken mainly.   Patient expressed that she has a desire to get back to her routine as she was before. Patient mentioned that she has not gained weight, but have not lost weight either and realize that it came from not being active as she previously was weeks prior.    Coaching Outcomes: Patient stated that she returns to work tomorrow, and is getting better mentally. Patient shared that in doing so, she plans to resume her exercise on 3/21.  Patient mentioned that she learned from this experience that she can continue to be engage in exercise even if she breaks it into 10-minute workouts. Patient shared that she can use the apple app that has 10-20-minute treadmill workouts or the 30-45-minute treadmill workouts she likes on iFit.  Patient will continue to implement her action plan as outlined above over the next two weeks to get back on track with achieving goals.   Attempted: Partial - Patient was able to maintain her healthy eating habits outside of the one meal consumed from fast food.   Not met - Patient did not exercise over the past two weeks per health coaching agreement.   Referrals: N/A  Resources: N/A

## 2023-06-26 ENCOUNTER — Ambulatory Visit (INDEPENDENT_AMBULATORY_CARE_PROVIDER_SITE_OTHER): Payer: 59 | Admitting: Pharmacist Clinician (PhC)/ Clinical Pharmacy Specialist

## 2023-06-26 ENCOUNTER — Encounter: Payer: Self-pay | Admitting: Pharmacist Clinician (PhC)/ Clinical Pharmacy Specialist

## 2023-06-26 VITALS — BP 124/85 | HR 72

## 2023-06-26 DIAGNOSIS — I10 Essential (primary) hypertension: Secondary | ICD-10-CM

## 2023-06-26 NOTE — Progress Notes (Signed)
 Office Visit    Patient Name: Nicole Rosario Date of Encounter: 06/26/2023  Primary Care Provider:  Willow Ora, MD Primary Cardiologist:  Thomasene Ripple, DO  Chief Complaint    Hypertension  Significant Past Medical History   CAD CAC = 7.02 (82nd percentile)  HLD LDL 82, TG 223; on atorvastatin 10  Paroxysmal SVT   obesity BMI 35.1       Allergies  Allergen Reactions   Erythromycin Nausea Only and Nausea And Vomiting   Amlodipine Swelling    LEE    History of Present Illness    Nicole Rosario is a 58 y.o. female patient of Dr Servando Salina, in the office today for hypertension evaluation.  She was seen last week by Dr. Servando Salina, with a BP of 146/78, although she noted home readings had been gradually increasing, as high as mid 160's systolic and low 08'M diastolic.  Dr. Servando Salina increased the carvedilol to 12.5 mg twice daily.  She felt a bit sluggish with the increase of carvedilol.  I encouraged her to give it another week, however she sent a message about 2 weeks after that, noting lower HR and still feeling sluggish.  We decreased the carvedilol to 6.25 mg and added amlodipine 5 mg.    Today she returns for follow up. She notes that she had to stop amlodipine about 2 weeks ago 2/2 leg swelling.  Since then her blood pressure has been staying WNL when checking at home.  She does feel more irregular beats, but also admits that life has been more stressful in the past 2 weeks.    Blood Pressure Goal:  130/80  Current Medications:  carvedilol 6.25 mg bid  Family Hx: both parents had hypertension, neither died from heart related issues; sister w/o htn, no kids  Social Hx:      Tobacco: no  Alcohol: rarely  Caffeine: half caffeine coffee in the morning, otherwise decaf  Diet:    has gone to [employer], now whole grain breads; no sodas, quit snacking; fish and chicken for protein; fruits/veggies usually fresh.  Continues to work on weight loss through better  eating/exercise  Exercise: walks dogs daily (3 small dogs)  Home BP readings:  readings averaged on her phone:  Last 3 readings   118/72  Last 7 days         115/70   Last 30 days       122/74  Accessory Clinical Findings    Lab Results  Component Value Date   CREATININE 0.65 11/19/2022   BUN 13 11/19/2022   NA 138 11/19/2022   K 4.1 11/19/2022   CL 104 11/19/2022   CO2 25 11/19/2022   Lab Results  Component Value Date   ALT 32 11/19/2022   AST 24 11/19/2022   ALKPHOS 89 11/19/2022   BILITOT 0.3 11/19/2022   Lab Results  Component Value Date   HGBA1C 5.6 03/27/2021    Home Medications    Current Outpatient Medications  Medication Sig Dispense Refill   ALPRAZolam (XANAX) 0.5 MG tablet Take 1 tablet (0.5 mg total) by mouth daily as needed for anxiety. 30 tablet 5   aspirin EC 81 MG tablet Take 1 tablet by mouth daily.     atorvastatin (LIPITOR) 10 MG tablet Take 1 tablet (10 mg total) by mouth at bedtime. 90 tablet 3   CALCIUM-MAGNESIUM-ZINC PO Take 1 tablet by mouth daily.     carvedilol (COREG) 6.25 MG tablet Take 1 tablet (  6.25 mg total) by mouth 2 (two) times daily. 180 tablet 3   Cholecalciferol (VITAMIN D3) 125 MCG (5000 UT) TABS Take 5,000 Units by mouth daily.     cyanocobalamin (VITAMIN B12) 1000 MCG tablet Take 1,000 mcg by mouth 2 (two) times a week.     ferrous sulfate (SLOW RELEASE IRON) 160 (50 Fe) MG TBCR SR tablet Take 160 mg by mouth daily.     methocarbamol (ROBAXIN-750) 750 MG tablet Take 1 tablet (750 mg total) by mouth 3 (three) times daily as needed for muscle spasms. 90 tablet 0   Omega-3 Fatty Acids (FISH OIL) 1000 MG CPDR Take 1 tablet by mouth daily.     No current facility-administered medications for this visit.         Assessment & Plan    Essential hypertension Assessment: BP is uncontrolled in office BP 124/85 mmHg;  diastolic above the goal (<130/80). Home averages show diastolic WNL, averaging in the low  70's Tolerates  carvedilol 6.25 well without any side effects Denies SOB, palpitation, chest pain, headaches,or swelling Reiterated the importance of regular exercise and low salt diet   Plan:  Continue taking carvedilol 6.25 mg twice daily Patient to keep record of BP readings with heart rate and report to Dr. Servando Salina at the next visit Patient to follow up with Dr. Servando Salina on May 7  Labs ordered today:  none She will reach out to me if home readings show elevations > 130/80   Phillips Hay PharmD CPP [facility]  [address] [place], [place] [address] [phone]

## 2023-06-26 NOTE — Patient Instructions (Signed)
 Follow up appointment: with Dr. Servando Salina on May 7  Take your BP meds as follows: continue with carvedilol 6.25 mg twice daily  Please reach out to me if you see the home readings average > 130/80 for more than a couple of weeks  Check your blood pressure at home 2-3 times per week and keep record of the readings.  Your blood pressure goal is < 130/80  To check your pressure at home you will need to:  1. Sit up in a chair, with feet flat on the floor and back supported. Do not cross your ankles or legs. 2. Rest your left arm so that the cuff is about heart level. If the cuff goes on your upper arm,  then just relax the arm on the table, arm of the chair or your lap. If you have a wrist cuff, we  suggest relaxing your wrist against your chest (think of it as Pledging the Flag with the  wrong arm).  3. Place the cuff snugly around your arm, about 1 inch above the crook of your elbow. The  cords should be inside the groove of your elbow.  4. Sit quietly, with the cuff in place, for about 5 minutes. After that 5 minutes press the power  button to start a reading. 5. Do not talk or move while the reading is taking place.  6. Record your readings on a sheet of paper. Although most cuffs have a memory, it is often  easier to see a pattern developing when the numbers are all in front of you.  7. You can repeat the reading after 1-3 minutes if it is recommended  Make sure your bladder is empty and you have not had caffeine or tobacco within the last 30 min  Always bring your blood pressure log with you to your appointments. If you have not brought your monitor in to be double checked for accuracy, please bring it to your next appointment.  You can find a list of quality blood pressure cuffs at [email or web address]  Important lifestyle changes to control high blood pressure  Intervention  Effect on the BP  Lose extra pounds and watch your waistline Weight loss is one of the most effective lifestyle  changes for controlling blood pressure. If you're overweight or obese, losing even a small amount of weight can help reduce blood pressure. Blood pressure might go down by about 1 millimeter of mercury (mm Hg) with each kilogram (about 2.2 pounds) of weight lost.  Exercise regularly As a general goal, aim for at least 30 minutes of moderate physical activity every day. Regular physical activity can lower high blood pressure by about 5 to 8 mm Hg.  Eat a healthy diet Eating a diet rich in whole grains, fruits, vegetables, and low-fat dairy products and low in saturated fat and cholesterol. A healthy diet can lower high blood pressure by up to 11 mm Hg.  Reduce salt (sodium) in your diet Even a small reduction of sodium in the diet can improve heart health and reduce high blood pressure by about 5 to 6 mm Hg.  Limit alcohol One drink equals 12 ounces of beer, 5 ounces of wine, or 1.5 ounces of 80-proof liquor.  Limiting alcohol to less than one drink a day for women or two drinks a day for men can help lower blood pressure by about 4 mm Hg.   If you have any questions or concerns please use My Chart to send questions or call  the office at [phone]

## 2023-06-26 NOTE — Assessment & Plan Note (Signed)
 Assessment: BP is uncontrolled in office BP 124/85 mmHg;  diastolic above the goal (<130/80). Home averages show diastolic WNL, averaging in the low  70's Tolerates carvedilol 6.25 well without any side effects Denies SOB, palpitation, chest pain, headaches,or swelling Reiterated the importance of regular exercise and low salt diet   Plan:  Continue taking carvedilol 6.25 mg twice daily Patient to keep record of BP readings with heart rate and report to Dr. Servando Salina at the next visit Patient to follow up with Dr. Servando Salina on May 7  Labs ordered today:  none She will reach out to me if home readings show elevations > 130/80

## 2023-07-09 ENCOUNTER — Ambulatory Visit: Payer: Self-pay | Attending: Cardiology

## 2023-07-09 DIAGNOSIS — Z0000 Encounter for general adult medical examination without abnormal findings: Secondary | ICD-10-CM

## 2023-07-09 NOTE — Progress Notes (Signed)
 Appointment Outcome: Completed, Session #: 2                        Start time: 1:05pm   End time: 1:38pm   Total Mins: 33 minutes  AGREEMENTS SECTION   Overall Goal(s): Improve healthy eating habits and increase exercise to lower triglycerides, blood pressure, and lose 1.5 pounds per week over the next 12 weeks.     Agreement/Action Steps:  Improve healthy eating habits Follow the Mediterranean diet/fresh foods Eat one meal per day Juice for breakfast Healthy snack for lunch No soft drinks No refined sugars/sugary processed foods     Increase exercise Exercise at least 5-6 days per week Walk on treadmill for 20-24 minutes at 2.5-3 Lift free weights    Progress Notes:  Patient reports that she has been able to maintain her healthy eating habits via the mediterranean diet over the past two weeks.   Patient mentioned that she has not juiced over the past two weeks because she is focusing on reducing sugar intake and calories consumed daily.  Patient normally would earn treats by walking on the treadmill and burning extra calories so she could eat ice cream. Patient shared now that the weather is getting warmer, she is going to have a challenge with eating ice cream.  Patient continued to incorporate homemade seed crackers and granola, yogurt, along with vegetables as healthy snacks. Patient also eats olives but is mindful of her sodium intake. Patient has remained soda free over the past two weeks as well.   Patient incorporates steel cut oats cooked in unsweetened almond milk for breakfast here and there. Patient continues to normally eat only dinner that consist of chicken or fish and some vegetables.   Patient used the iFit app mainly to guide her workouts on the treadmill walking. Patient has included yard work into her routine to increase movement by lifting and carrying items, as well as, raking. Patient stated that she used doing yard work 3-4 days per week from 9am-4pm  with minimal breaks as an excuse not to lift weights.   Patient stated that she used the treadmill on the days that she did not do yard work. Patient mentioned that she wants to get back to using the free weights to increase her stamina and strength.     Coaching Outcomes: Patient will continue to implement her health coaching agreement/action steps as outlined above over the next two weeks.    Discussed with patient an alternative healthy snack for eating ice cream, such as frozen yogurt and fruit. Patient expressed interest in learning more healthy food ideas that she can incorporate in her diet.    Attempted: Partial - Patient was able to implement her health coaching agreement/action steps in its entirety except for lifting free weights over the past two weeks.    Referrals: N/A  Resources: Provided patient with the website to the American Heart Association as a resource to find healthy snacks/desserts as an alternative to ice cream and for other recipes.

## 2023-07-19 ENCOUNTER — Telehealth: Payer: Self-pay | Admitting: Licensed Clinical Social Worker

## 2023-07-19 NOTE — Telephone Encounter (Signed)
 Patient was contacted to inform that Health Coaching is no longer available and scheduled visit will be cancelled. The provider will be informed so that alternate referrals or resources can be provided. Lasandra Beech, LCSW, CCSW-MCS [phone]

## 2023-07-23 ENCOUNTER — Ambulatory Visit: Payer: Self-pay

## 2023-08-05 DIAGNOSIS — Z124 Encounter for screening for malignant neoplasm of cervix: Secondary | ICD-10-CM | POA: Diagnosis not present

## 2023-08-05 DIAGNOSIS — N939 Abnormal uterine and vaginal bleeding, unspecified: Secondary | ICD-10-CM | POA: Diagnosis not present

## 2023-08-14 ENCOUNTER — Ambulatory Visit: Payer: 59 | Attending: Cardiology | Admitting: Cardiology

## 2023-08-14 ENCOUNTER — Encounter: Payer: Self-pay | Admitting: Cardiology

## 2023-08-14 VITALS — BP 126/74 | HR 68 | Ht 64.0 in | Wt 190.0 lb

## 2023-08-14 DIAGNOSIS — I10 Essential (primary) hypertension: Secondary | ICD-10-CM

## 2023-08-14 DIAGNOSIS — E781 Pure hyperglyceridemia: Secondary | ICD-10-CM | POA: Diagnosis not present

## 2023-08-14 DIAGNOSIS — I2510 Atherosclerotic heart disease of native coronary artery without angina pectoris: Secondary | ICD-10-CM | POA: Diagnosis not present

## 2023-08-14 DIAGNOSIS — E782 Mixed hyperlipidemia: Secondary | ICD-10-CM

## 2023-08-14 NOTE — Patient Instructions (Signed)
 Medication Instructions:  Your physician recommends that you continue on your current medications as directed. Please refer to the Current Medication list given to you today.  *If you need a refill on your cardiac medications before your next appointment, please call your pharmacy*  Lab Work: Lipids If you have labs (blood work) drawn today and your tests are completely normal, you will receive your results only by: MyChart Message (if you have MyChart) OR A paper copy in the mail If you have any lab test that is abnormal or we need to change your treatment, we will call you to review the results.   Follow-Up: At [facility], you and your health needs are our priority.  As part of our continuing mission to provide you with exceptional heart care, our providers are all part of one team.  This team includes your primary Cardiologist (physician) and Advanced Practice Providers or APPs (Physician Assistants and Nurse Practitioners) who all work together to provide you with the care you need, when you need it.  Your next appointment:   1 year(s)  Provider:   Kardie Tobb, DO

## 2023-08-14 NOTE — Progress Notes (Signed)
 Cardiology Office Note:    Date:  08/14/2023   ID:  Nicole Rosario, DOB [birth year], MRN [id number]  PCP:  Luevenia Saha, MD  Cardiologist:  Jerryl Morin, DO  Electrophysiologist:  None   Referring MD: Luevenia Saha, MD   " My blood pressure is higher since I last saw you"  History of Present Illness:    Nicole Rosario is a 58 y.o. female with a hx of hyperlipidemia, obesity, hypertension,  hyperlipidemia, hypertriglyceridemia, paroxysmal SVT  and coronary calcification here today for a follow up visit.  At her last visit her blood pressure was elevated increased her medication to carvedilol  6.25 mg twice daily.  We repeated lipid profile.  Triglycerides still elevated.  She over the complaints at this time.   Past Medical History:  Diagnosis Date   Cholelithiasis 04/22/2019   Colon cancer Stage III at splenic flexure; followed at [facility]; s/p surgery and chemo. 04/09/2012   Fatty liver 09/18/2017   GERD (gastroesophageal reflux disease)    Hyperlipidemia 10/08/2011   Obesity (BMI 30-39.9) 09/18/2017   Positive TB test    Seasonal allergies 10/08/2011   Vasomotor symptoms due to menopause 09/18/2017    Past Surgical History:  Procedure Laterality Date   COLON SURGERY     HEMICOLECTOMY  2013   LUMBAR DISC SURGERY     MICRODISCECTOMY LUMBAR  2019    Current Medications: Current Meds  Medication Sig   ALPRAZolam  (XANAX ) 0.5 MG tablet Take 1 tablet (0.5 mg total) by mouth daily as needed for anxiety.   aspirin EC 81 MG tablet Take 1 tablet by mouth daily.   atorvastatin  (LIPITOR) 10 MG tablet Take 1 tablet (10 mg total) by mouth at bedtime.   CALCIUM -MAGNESIUM-ZINC PO Take 1 tablet by mouth daily.   carvedilol  (COREG ) 6.25 MG tablet Take 1 tablet (6.25 mg total) by mouth 2 (two) times daily.   Cholecalciferol (VITAMIN D3) 125 MCG (5000 UT) TABS Take 5,000 Units by mouth daily.   cyanocobalamin  (VITAMIN B12) 1000 MCG tablet Take 1,000 mcg by mouth 2 (two) times a week.   ferrous sulfate  (SLOW RELEASE IRON) 160 (50 Fe) MG TBCR SR tablet Take 160 mg by mouth daily.   methocarbamol  (ROBAXIN -750) 750 MG tablet Take 1 tablet (750 mg total) by mouth 3 (three) times daily as needed for muscle spasms.   Omega-3 Fatty Acids (FISH OIL) 1000 MG CPDR Take 1 tablet by mouth daily.     Allergies:   Erythromycin and Amlodipine    Social History   Socioeconomic History   Marital status: Married    Spouse name: Miki Alert   Number of children: 0   Years of education: Not on file   Highest education level: Associate degree: occupational, Scientist, product/process development, or vocational program  Occupational History   Occupation: Teacher, adult education: [employer]    Comment: [employer]ICS  Tobacco Use   Smoking status: Former   Smokeless tobacco: Never  Advertising account planner   Vaping status: Never Used  Substance and Sexual Activity   Alcohol use: Yes    Alcohol/week: 0.0 standard drinks of alcohol    Comment: social   Drug use: Never   Sexual activity: Yes    Birth control/protection: Post-menopausal  Other Topics Concern   Not on file  Social History Narrative   Not on file   Social Drivers of Health   Financial Resource Strain: Not on file  Food Insecurity: Not on file  Transportation Needs: Not on file  Physical Activity: Not on file  Stress: Not on file  Social Connections: Not on file     Family History: The patient's family history includes Atrial fibrillation in her father and mother; Bladder Cancer in her father; Cancer in her paternal grandmother; Colon cancer in her maternal grandmother; Heart disease in her maternal aunt, maternal grandfather, and mother; Hypertension in her father; Lupus in her sister; Other in her mother; Parkinson's disease in her mother; Sick sinus syndrome in her mother.  ROS:   Review of Systems  Constitution: Negative for decreased appetite, fever and weight gain.  HENT: Negative for congestion, ear discharge, hoarse voice and sore throat.   Eyes: Negative for discharge, redness,  vision loss in right eye and visual halos.  Cardiovascular: Negative for chest pain, dyspnea on exertion, leg swelling, orthopnea and palpitations.  Respiratory: Negative for cough, hemoptysis, shortness of breath and snoring.   Endocrine: Negative for heat intolerance and polyphagia.  Hematologic/Lymphatic: Negative for bleeding problem. Does not bruise/bleed easily.  Skin: Negative for flushing, nail changes, rash and suspicious lesions.  Musculoskeletal: Negative for arthritis, joint pain, muscle cramps, myalgias, neck pain and stiffness.  Gastrointestinal: Negative for abdominal pain, bowel incontinence, diarrhea and excessive appetite.  Genitourinary: Negative for decreased libido, genital sores and incomplete emptying.  Neurological: Negative for brief paralysis, focal weakness, headaches and loss of balance.  Psychiatric/Behavioral: Negative for altered mental status, depression and suicidal ideas.  Allergic/Immunologic: Negative for HIV exposure and persistent infections.    EKGs/Labs/Other Studies Reviewed:    The following studies were reviewed today:   EKG:  The ekg ordered today demonstrates sinus rhythm, heart 67 bpm with incomplete rbbb, old anteroseptal infarction and t-wave inversion in the anterior leads - compared to prior EKG t wave inversion is new  Recent Labs: 11/19/2022: ALT 32; BUN 13; Creatinine, Ser 0.65; Hemoglobin 14.0; Platelets 295.0; Potassium 4.1; Sodium 138; TSH 2.77  Recent Lipid Panel    Component Value Date/Time   CHOL 166 05/08/2023 1101   TRIG 223 (H) 05/08/2023 1101   HDL 46 05/08/2023 1101   CHOLHDL 3.6 05/08/2023 1101   CHOLHDL 5 11/19/2022 1015   VLDL 50.0 (H) 11/19/2022 1015   LDLCALC 83 05/08/2023 1101   LDLCALC 161 (H) 11/23/2019 0958   LDLDIRECT 167.0 11/19/2022 1015    Physical Exam:    VS:  BP 126/74 (BP Location: Right Arm, Patient Position: Sitting, Cuff Size: Normal)   Pulse 68   Ht 5\' 4"  (1.626 m)   Wt 190 lb (86.2 kg)    SpO2 97%   BMI 32.61 kg/m     Wt Readings from Last 3 Encounters:  08/14/23 190 lb (86.2 kg)  05/08/23 204 lb 9.6 oz (92.8 kg)  11/19/22 199 lb 9.6 oz (90.5 kg)     GEN: Well nourished, well developed in no acute distress HEENT: Normal NECK: No JVD; No carotid bruits LYMPHATICS: No lymphadenopathy CARDIAC: S1S2 noted,RRR, no murmurs, rubs, gallops RESPIRATORY:  Clear to auscultation without rales, wheezing or rhonchi  ABDOMEN: Soft, non-tender, non-distended, +bowel sounds, no guarding. EXTREMITIES: No edema, No cyanosis, no clubbing MUSCULOSKELETAL:  No deformity  SKIN: Warm and dry NEUROLOGIC:  Alert and oriented x 3, non-focal PSYCHIATRIC:  Normal affect, good insight  ASSESSMENT:    1. Mixed hyperlipidemia   2. Hypertriglyceridemia   3. Coronary artery calcification seen on CAT scan   4. Primary hypertension     PLAN:    Hypertension Hypertriglyceridemia/Hyperlipidemia  Coronary Artery calcification.  Today we discussed  her lab work on January 2025.  She prefers for us  to repeat blood work before starting medication.  If her triglycerides remains elevated and LDL remains greater than 70 will increase her Lipitor to 20 mg daily.  However if LDL is less than 70 and triglyceride remains elevated we discussed the use of fenofibrate 145 mg daily.  Life style modification - would consider futher management would benefit from our health coach evaluatio .  The patient is in agreement with the above plan. The patient left the office in stable condition.  The patient will follow up in   Medication Adjustments/Labs and Tests Ordered: Current medicines are reviewed at length with the patient today.  Concerns regarding medicines are outlined above.  Orders Placed This Encounter  Procedures   Lipid panel   No orders of the defined types were placed in this encounter.   Patient Instructions  Medication Instructions:  Your physician recommends that you continue on your  current medications as directed. Please refer to the Current Medication list given to you today.  *If you need a refill on your cardiac medications before your next appointment, please call your pharmacy*  Lab Work: Lipids If you have labs (blood work) drawn today and your tests are completely normal, you will receive your results only by: MyChart Message (if you have MyChart) OR A paper copy in the mail If you have any lab test that is abnormal or we need to change your treatment, we will call you to review the results.   Follow-Up: At [facility], you and your health needs are our priority.  As part of our continuing mission to provide you with exceptional heart care, our providers are all part of one team.  This team includes your primary Cardiologist (physician) and Advanced Practice Providers or APPs (Physician Assistants and Nurse Practitioners) who all work together to provide you with the care you need, when you need it.  Your next appointment:   1 year(s)  Provider:   Savahanna Almendariz, DO      Adopting a Healthy Lifestyle.  Know what a healthy weight is for you (roughly BMI <25) and aim to maintain this   Aim for 7+ servings of fruits and vegetables daily   65-80+ fluid ounces of water or unsweet tea for healthy kidneys   Limit to max 1 drink of alcohol per day; avoid smoking/tobacco   Limit animal fats in diet for cholesterol and heart health - choose grass fed whenever available   Avoid highly processed foods, and foods high in saturated/trans fats   Aim for low stress - take time to unwind and care for your mental health   Aim for 150 min of moderate intensity exercise weekly for heart health, and weights twice weekly for bone health   Aim for 7-9 hours of sleep daily   When it comes to diets, agreement about the perfect plan isnt easy to find, even among the experts. Experts at the [employer] developed an idea known as the Healthy  Eating Plate. Just imagine a plate divided into logical, healthy portions.   The emphasis is on diet quality:   Load up on vegetables and fruits - one-half of your plate: Aim for color and variety, and remember that potatoes dont count.   Go for whole grains - one-quarter of your plate: Whole wheat, barley, wheat berries, quinoa, oats, brown rice, and foods made with them. If you want pasta, go with whole wheat  pasta.   Protein power - one-quarter of your plate: Fish, chicken, beans, and nuts are all healthy, versatile protein sources. Limit red meat.   The diet, however, does go beyond the plate, offering a few other suggestions.   Use healthy plant oils, such as olive, canola, soy, corn, sunflower and peanut. Check the labels, and avoid partially hydrogenated oil, which have unhealthy trans fats.   If youre thirsty, drink water. Coffee and tea are good in moderation, but skip sugary drinks and limit milk and dairy products to one or two daily servings.   The type of carbohydrate in the diet is more important than the amount. Some sources of carbohydrates, such as vegetables, fruits, whole grains, and beans-are healthier than others.   Finally, stay active  Signed, Jerryl Morin, DO  08/14/2023 9:09 AM    [facility]

## 2023-08-15 LAB — LIPID PANEL
Chol/HDL Ratio: 3.4 ratio (ref 0.0–4.4)
Cholesterol, Total: 138 mg/dL (ref 100–199)
HDL: 41 mg/dL (ref >39)
LDL Chol Calc (NIH): 55 mg/dL (ref 0–99)
Triglycerides: 270 mg/dL — ABNORMAL HIGH (ref 0–149)
VLDL Cholesterol Cal: 42 mg/dL — ABNORMAL HIGH (ref 5–40)

## 2023-08-20 ENCOUNTER — Other Ambulatory Visit: Payer: Self-pay

## 2023-08-20 ENCOUNTER — Other Ambulatory Visit (HOSPITAL_COMMUNITY): Payer: Self-pay

## 2023-08-20 ENCOUNTER — Encounter: Payer: Self-pay | Admitting: Cardiology

## 2023-08-20 ENCOUNTER — Ambulatory Visit: Payer: Self-pay | Admitting: Cardiology

## 2023-08-20 DIAGNOSIS — Z79899 Other long term (current) drug therapy: Secondary | ICD-10-CM

## 2023-08-20 DIAGNOSIS — E782 Mixed hyperlipidemia: Secondary | ICD-10-CM

## 2023-08-20 DIAGNOSIS — E781 Pure hyperglyceridemia: Secondary | ICD-10-CM

## 2023-08-20 MED ORDER — FENOFIBRATE 145 MG PO TABS
145.0000 mg | ORAL_TABLET | Freq: Every day | ORAL | 3 refills | Status: AC
  Filled 2023-08-20: qty 90, 90d supply, fill #0
  Filled 2023-11-17 – 2023-12-03 (×2): qty 90, 90d supply, fill #1
  Filled 2024-03-26: qty 90, 90d supply, fill #2

## 2023-08-20 NOTE — Progress Notes (Signed)
 Lab orders placed and prescription sent to pharmacy.

## 2023-09-11 ENCOUNTER — Other Ambulatory Visit: Payer: Self-pay

## 2023-09-18 DIAGNOSIS — N859 Noninflammatory disorder of uterus, unspecified: Secondary | ICD-10-CM | POA: Diagnosis not present

## 2023-09-18 DIAGNOSIS — N939 Abnormal uterine and vaginal bleeding, unspecified: Secondary | ICD-10-CM | POA: Diagnosis not present

## 2023-10-18 DIAGNOSIS — R897 Abnormal histological findings in specimens from other organs, systems and tissues: Secondary | ICD-10-CM | POA: Diagnosis not present

## 2023-10-24 DIAGNOSIS — N858 Other specified noninflammatory disorders of uterus: Secondary | ICD-10-CM | POA: Diagnosis not present

## 2023-10-24 DIAGNOSIS — N8502 Endometrial intraepithelial neoplasia [EIN]: Secondary | ICD-10-CM | POA: Diagnosis not present

## 2023-10-24 DIAGNOSIS — Z7189 Other specified counseling: Secondary | ICD-10-CM | POA: Diagnosis not present

## 2023-11-04 DIAGNOSIS — C541 Malignant neoplasm of endometrium: Secondary | ICD-10-CM | POA: Diagnosis not present

## 2023-11-04 DIAGNOSIS — Z08 Encounter for follow-up examination after completed treatment for malignant neoplasm: Secondary | ICD-10-CM | POA: Diagnosis not present

## 2023-11-04 DIAGNOSIS — Z85038 Personal history of other malignant neoplasm of large intestine: Secondary | ICD-10-CM | POA: Diagnosis not present

## 2023-11-10 ENCOUNTER — Other Ambulatory Visit: Payer: Self-pay | Admitting: Family Medicine

## 2023-11-11 ENCOUNTER — Other Ambulatory Visit (HOSPITAL_BASED_OUTPATIENT_CLINIC_OR_DEPARTMENT_OTHER): Payer: Self-pay

## 2023-11-11 MED ORDER — METHOCARBAMOL 750 MG PO TABS
750.0000 mg | ORAL_TABLET | Freq: Three times a day (TID) | ORAL | 0 refills | Status: DC | PRN
  Filled 2023-11-11: qty 90, 30d supply, fill #0

## 2023-11-11 MED ORDER — ALPRAZOLAM 0.5 MG PO TABS
0.5000 mg | ORAL_TABLET | Freq: Every day | ORAL | 5 refills | Status: AC | PRN
  Filled 2023-11-11: qty 30, 30d supply, fill #0
  Filled 2023-12-03 – 2023-12-22 (×2): qty 30, 30d supply, fill #1
  Filled 2024-02-02: qty 30, 30d supply, fill #2
  Filled 2024-03-26: qty 30, 30d supply, fill #3
  Filled 2024-04-29: qty 30, 30d supply, fill #4

## 2023-11-11 MED ORDER — ATORVASTATIN CALCIUM 10 MG PO TABS
10.0000 mg | ORAL_TABLET | Freq: Every evening | ORAL | 3 refills | Status: AC
Start: 2023-11-11 — End: ?
  Filled 2023-11-11 – 2023-12-03 (×2): qty 90, 90d supply, fill #0
  Filled 2024-03-26: qty 90, 90d supply, fill #1

## 2023-11-11 NOTE — Telephone Encounter (Signed)
 8/24 LOV

## 2023-11-12 ENCOUNTER — Other Ambulatory Visit (HOSPITAL_COMMUNITY): Payer: Self-pay

## 2023-11-14 ENCOUNTER — Other Ambulatory Visit (HOSPITAL_COMMUNITY): Payer: Self-pay

## 2023-11-14 DIAGNOSIS — Z7189 Other specified counseling: Secondary | ICD-10-CM | POA: Diagnosis not present

## 2023-11-14 DIAGNOSIS — C541 Malignant neoplasm of endometrium: Secondary | ICD-10-CM | POA: Diagnosis not present

## 2023-11-15 DIAGNOSIS — N80399 Endometriosis of the pelvic peritoneum, other specified sites, unspecified depth: Secondary | ICD-10-CM | POA: Diagnosis not present

## 2023-11-15 DIAGNOSIS — C541 Malignant neoplasm of endometrium: Secondary | ICD-10-CM | POA: Diagnosis not present

## 2023-11-15 DIAGNOSIS — Z6833 Body mass index (BMI) 33.0-33.9, adult: Secondary | ICD-10-CM | POA: Diagnosis not present

## 2023-11-15 DIAGNOSIS — E669 Obesity, unspecified: Secondary | ICD-10-CM | POA: Diagnosis not present

## 2023-11-15 DIAGNOSIS — N840 Polyp of corpus uteri: Secondary | ICD-10-CM | POA: Diagnosis not present

## 2023-11-15 DIAGNOSIS — D251 Intramural leiomyoma of uterus: Secondary | ICD-10-CM | POA: Diagnosis not present

## 2023-11-15 DIAGNOSIS — Z9049 Acquired absence of other specified parts of digestive tract: Secondary | ICD-10-CM | POA: Diagnosis not present

## 2023-11-15 DIAGNOSIS — N80329 Endometriosis of the posterior cul-de-sac, unspecified depth: Secondary | ICD-10-CM | POA: Diagnosis not present

## 2023-11-15 DIAGNOSIS — Z9221 Personal history of antineoplastic chemotherapy: Secondary | ICD-10-CM | POA: Diagnosis not present

## 2023-11-15 DIAGNOSIS — N888 Other specified noninflammatory disorders of cervix uteri: Secondary | ICD-10-CM | POA: Diagnosis not present

## 2023-11-17 ENCOUNTER — Other Ambulatory Visit: Payer: Self-pay | Admitting: Medical Genetics

## 2023-11-20 ENCOUNTER — Encounter: Payer: 59 | Admitting: Family Medicine

## 2023-11-28 ENCOUNTER — Other Ambulatory Visit (HOSPITAL_COMMUNITY): Payer: Self-pay

## 2023-12-03 ENCOUNTER — Other Ambulatory Visit (HOSPITAL_COMMUNITY): Payer: Self-pay

## 2023-12-03 ENCOUNTER — Other Ambulatory Visit: Payer: Self-pay | Admitting: Family Medicine

## 2023-12-03 ENCOUNTER — Other Ambulatory Visit: Payer: Self-pay

## 2023-12-03 DIAGNOSIS — Z1231 Encounter for screening mammogram for malignant neoplasm of breast: Secondary | ICD-10-CM

## 2023-12-03 DIAGNOSIS — C55 Malignant neoplasm of uterus, part unspecified: Secondary | ICD-10-CM | POA: Insufficient documentation

## 2023-12-05 ENCOUNTER — Ambulatory Visit
Admission: RE | Admit: 2023-12-05 | Discharge: 2023-12-05 | Disposition: A | Discharge status: Home / Self Care | Source: Ambulatory Visit | Attending: Family Medicine | Admitting: Family Medicine

## 2023-12-05 DIAGNOSIS — Z1231 Encounter for screening mammogram for malignant neoplasm of breast: Secondary | ICD-10-CM | POA: Diagnosis not present

## 2023-12-11 ENCOUNTER — Ambulatory Visit (INDEPENDENT_AMBULATORY_CARE_PROVIDER_SITE_OTHER): Admitting: Family Medicine

## 2023-12-11 ENCOUNTER — Encounter: Payer: Self-pay | Admitting: Family Medicine

## 2023-12-11 ENCOUNTER — Other Ambulatory Visit: Payer: Self-pay

## 2023-12-11 ENCOUNTER — Other Ambulatory Visit (HOSPITAL_COMMUNITY): Payer: Self-pay

## 2023-12-11 VITALS — BP 126/78 | HR 64 | Temp 97.9°F | Ht 64.0 in

## 2023-12-11 DIAGNOSIS — Z9049 Acquired absence of other specified parts of digestive tract: Secondary | ICD-10-CM | POA: Diagnosis not present

## 2023-12-11 DIAGNOSIS — E538 Deficiency of other specified B group vitamins: Secondary | ICD-10-CM

## 2023-12-11 DIAGNOSIS — Z0001 Encounter for general adult medical examination with abnormal findings: Secondary | ICD-10-CM | POA: Diagnosis not present

## 2023-12-11 DIAGNOSIS — K760 Fatty (change of) liver, not elsewhere classified: Secondary | ICD-10-CM

## 2023-12-11 DIAGNOSIS — E669 Obesity, unspecified: Secondary | ICD-10-CM

## 2023-12-11 DIAGNOSIS — I471 Supraventricular tachycardia, unspecified: Secondary | ICD-10-CM | POA: Diagnosis not present

## 2023-12-11 DIAGNOSIS — Z85038 Personal history of other malignant neoplasm of large intestine: Secondary | ICD-10-CM

## 2023-12-11 DIAGNOSIS — E782 Mixed hyperlipidemia: Secondary | ICD-10-CM

## 2023-12-11 DIAGNOSIS — E781 Pure hyperglyceridemia: Secondary | ICD-10-CM

## 2023-12-11 DIAGNOSIS — I10 Essential (primary) hypertension: Secondary | ICD-10-CM | POA: Diagnosis not present

## 2023-12-11 DIAGNOSIS — I2510 Atherosclerotic heart disease of native coronary artery without angina pectoris: Secondary | ICD-10-CM | POA: Diagnosis not present

## 2023-12-11 MED ORDER — HYOSCYAMINE SULFATE 0.125 MG SL SUBL
0.1250 mg | SUBLINGUAL_TABLET | SUBLINGUAL | 0 refills | Status: DC | PRN
  Filled 2023-12-11: qty 30, 5d supply, fill #0

## 2023-12-11 NOTE — Progress Notes (Signed)
 Subjective  Chief Complaint  Patient presents with   Annual Exam    Pt here for Annual Exam and is not currently fasting. Mammo was done on 12/05/2023 at [facility](Not resulted) and pap(Normal) was done by [name], MD on 08/05/2023   Hypertension    HPI: Nicole Rosario is a 58 y.o. female who presents to [facility] [place] today for a Female Wellness Visit. She also has the concerns and/or needs as listed above in the chief complaint. These will be addressed in addition to the Health Maintenance Visit.   Wellness Visit: annual visit with health maintenance review and exam  HM: mammo and crc screen current.  Status post complete robotic assisted hysterectomy for new diagnosis endometrial cancer.  We discussed this diagnosis in detail.  She is to start chemotherapy in 2 days.  Also with radiation therapy.  I reviewed oncology notes and pathology reports.  She is coping with the diagnosis fairly well although it is very stressful.  She is recovering from her surgery well.  Will defer immunizations today since she is starting chemotherapy in 2 days.  She will ask her surgeon if she can get the flu shot and Prevnar shot this season. Chronic disease f/u and/or acute problem visit: (deemed necessary to be done in addition to the wellness visit): Chronic conditions including hypertension PSVT and cardiovascular disease are well-controlled.  I reviewed recent cardiology notes.  Added fenofibrate  to statin for hypertriglyceridemia and mixed hyperlipidemia.  She is due for fasting recheck.  She is not fasting today.  She is tolerating both medications well.  Blood pressure is well-controlled. No new GI symptoms. I reviewed multiple lab test that were done recently, liver test are stable.  B12 is stable.  Blood counts are stable. She requests Hyos Amin just in case she gets esophageal spasm during chemotherapy.  She had this problem with her last round of chemotherapy for  colorectal cancers treatment Assessment  1. Encounter for well adult exam with abnormal findings   2. ASCVD (arteriosclerotic cardiovascular disease)   3. Essential hypertension   4. H/O colon cancer, stage III   5. Hypertriglyceridemia   6. Obesity (BMI 30-39.9)   7. PSVT (paroxysmal supraventricular tachycardia) (HCC)   8. Hepatic steatosis mild by ultrasound   9. Hx of left hemicolectomy   10. Mixed hyperlipidemia   11. Vitamin B12 deficiency      Plan  Female Wellness Visit: Age appropriate Health Maintenance and Prevention measures were discussed with patient. Included topics are cancer screening recommendations, ways to keep healthy (see AVS) including dietary and exercise recommendations, regular eye and dental care, use of seat belts, and avoidance of moderate alcohol use and tobacco use.  BMI: discussed patient's BMI and encouraged positive lifestyle modifications to help get to or maintain a target BMI. HM needs and immunizations were addressed and ordered. See below for orders. See HM and immunization section for updates. Routine labs and screening tests ordered including cmp, cbc and lipids where appropriate. Discussed recommendations regarding Vit D and calcium  supplementation (see AVS)  Chronic disease management visit and/or acute problem visit: Endometrial cancer status post hysterectomy and for chemotherapy and radiation therapy next week.  Counseling given.  Support given.  Levsin  ordered just to be there in case she needs it.  Monitor mood.  Good support system. Cardiovascular disease including hypertension and PSVT are well-controlled. Next hyperlipidemia and hypertriglyceridemia medications.  Patient has scheduled fasting lab appointment for recheck upcoming  soon.  Liver test have been stable.  Refilled  Follow-up 1 year for complete physical sooner if needed Meds ordered this encounter  Medications   hyoscyamine  (LEVSIN  SL) 0.125 MG SL tablet    Sig: Place 1  tablet (0.125 mg total) under the tongue every 4 (four) hours as needed.    Dispense:  30 tablet    Refill:  0      Body mass index is 32.61 kg/m. Wt Readings from Last 3 Encounters:  08/14/23 190 lb (86.2 kg)  05/08/23 204 lb 9.6 oz (92.8 kg)  11/19/22 199 lb 9.6 oz (90.5 kg)     Patient Active Problem List   Diagnosis Date Noted   Hypertriglyceridemia 06/15/2022    Priority: High    Fish oil capsules    ASCVD (arteriosclerotic cardiovascular disease) 03/23/2021    Priority: High    CAC score 7.2 2022; nl lipoprotein A, Cardiology eval. Started bb. Normal echocardiogram Cards eval neg with low calcium  score and nl lipids, nl lipoprotein A. No cp  The 10-year ASCVD risk score (Arnett DK, et al., 2019) is: 3.8% and nl lipoprotein A. No statin indicated per cards 2024: LDL up to 167, trigs 250, ASCVD 4% but  given history, recommended statin.        PSVT (paroxysmal supraventricular tachycardia) (HCC) 03/23/2021    Priority: High   Essential hypertension 06/23/2020    Priority: High   Family history of premature CAD 04/22/2019    Priority: High   Obesity (BMI 30-39.9) 09/18/2017    Priority: High   H/O colon cancer, stage III 09/05/2014    Priority: High    Colonoscopy 11/28/2018; normal colon. 09/2021 normal: surveillance q 3 years with [facility]    Psychophysiological insomnia 11/19/2022    Priority: Medium     Started with loss of mother: xanax  prn; transitioned to trazodone  8.2024.     Hepatic steatosis mild by ultrasound 09/18/2017    Priority: Medium     Hepatic steatosis and family history of primary biliary cholangitis in mother. Her hepatologist recommends screening with mitochondrial antibodies every 2 years. 2021 done     Hx of left hemicolectomy 09/18/2017    Priority: Medium    Hx of microdiscectomy, L5-S1 09/18/2017    Priority: Medium    GERD (gastroesophageal reflux disease) 07/03/2012    Priority: Medium    Mixed hyperlipidemia 10/08/2011     Priority: Medium     2024 LDL167: recommended statin    Cholelithiasis 04/22/2019    Priority: Low    Asymptomatic. Incidental finding on ultrasound    Vitamin B12 deficiency 09/18/2017    Priority: Low   Seasonal allergies 10/08/2011    Priority: Low   Uterine cancer (HCC) 12/03/2023   Health Maintenance  Topic Date Due   Hepatitis B Vaccines 19-59 Average Risk (2 of 3 - 19+ 3-dose series) 05/07/1993   Cervical Cancer Screening (HPV/Pap Cotest)  06/29/2018   COVID-19 Vaccine (6 - 2025-26 season) 12/27/2023 (Originally 12/09/2023)   INFLUENZA VACCINE  07/07/2024 (Originally 11/08/2023)   Pneumococcal Vaccine: 50+ Years (1 of 2 - PCV) 12/10/2024 (Originally 04/12/1986)   Colonoscopy  10/02/2024   MAMMOGRAM  12/04/2024   DTaP/Tdap/Td (3 - Td or Tdap) 12/05/2027   Hepatitis C Screening  Completed   HIV Screening  Completed   Zoster Vaccines- Shingrix   Completed   HPV VACCINES  Aged Out   Meningococcal B Vaccine  Aged Out   Immunization History  Administered Date(s) Administered  Hepatitis B 04/09/1993   Hepatitis B, ADULT 04/09/1993   Influenza,inj,Quad PF,6+ Mos 01/30/2021, 01/21/2022   Influenza-Unspecified 01/08/2015, 01/23/2018, 02/01/2019, 01/08/2020   MMR 04/09/1988   PFIZER Comirnaty ETTERGray Top)Covid-19 Tri-Sucrose Vaccine 10/11/2020   PFIZER(Purple Top)SARS-COV-2 Vaccination 04/04/2019, 04/22/2019, 01/29/2020   Pfizer(Comirnaty )Fall Seasonal Vaccine 12 years and older 04/17/2022   Td 12/04/2017   Tdap 04/10/2003   Zoster Recombinant(Shingrix ) 06/23/2020, 12/14/2020   We updated and reviewed the patient's past history in detail and it is documented below. Allergies: Patient is allergic to erythromycin and amlodipine . Past Medical History Patient  has a past medical history of Cholelithiasis (04/22/2019), Colon cancer Stage III at splenic flexure; followed at [facility]; s/p surgery and chemo. (04/09/2012), Fatty liver (09/18/2017), GERD (gastroesophageal reflux disease),  Hyperlipidemia (10/08/2011), Obesity (BMI 30-39.9) (09/18/2017), Positive TB test, Seasonal allergies (10/08/2011), and Vasomotor symptoms due to menopause (09/18/2017). Past Surgical History Patient  has a past surgical history that includes Colon surgery; Microdiscectomy lumbar (2019); Hemicolectomy (2013); Lumbar disc surgery; Abdominal hysterectomy (11/15/23); and Spine surgery. Family History: Patient family history includes Atrial fibrillation in her father and mother; Bladder Cancer in her father; Cancer in her father, maternal aunt, maternal grandmother, maternal uncle, and paternal grandmother; Colon cancer in her maternal grandmother; Heart disease in her maternal aunt, maternal grandfather, and mother; Hypertension in her father and mother; Lupus in her sister; Other in her mother; Parkinson's disease in her mother; Sick sinus syndrome in her mother. Social History:  Patient  reports that she quit smoking about 12 years ago. Her smoking use included cigarettes. She has a 12.5 pack-year smoking history. She has never used smokeless tobacco. She reports current alcohol use. She reports that she does not use drugs.  Review of Systems: Constitutional: negative for fever or malaise Ophthalmic: negative for photophobia, double vision or loss of vision Cardiovascular: negative for chest pain, dyspnea on exertion, or new LE swelling Respiratory: negative for SOB or persistent cough Gastrointestinal: negative for abdominal pain, change in bowel habits or melena Genitourinary: negative for dysuria or gross hematuria, no abnormal uterine bleeding or disharge Musculoskeletal: negative for new gait disturbance or muscular weakness Integumentary: negative for new or persistent rashes, no breast lumps Neurological: negative for TIA or stroke symptoms Psychiatric: negative for SI or delusions Allergic/Immunologic: negative for hives  Patient Care Team    Relationship Specialty Notifications Start End   Jodie Lavern CROME, MD PCP - General Family Medicine  04/22/19   Sheena Pugh, DO PCP - Cardiology Cardiology  03/23/21   Unice Pac, MD Consulting Physician Neurosurgery  03/31/18   [name], MD Consulting Physician Obstetrics and Gynecology  03/31/18   Terri Alan PARAS, MD Consulting Physician Otolaryngology  05/16/18   Vernice Albesa Melia, PA-C Consulting Physician Hematology and Oncology  06/23/20   Jowell, Paul, MD Consulting Physician Gastroenterology  12/15/21    Comment: [facility]  Arnaldo Juliene RAMAN, MD Consulting Physician Sports Medicine  11/19/22   Jama No (Inactive)  Cardiology  06/07/23    Comment: Care GuideHealth Coach    Objective  Vitals: BP 126/78   Pulse 64   Temp 97.9 F (36.6 C)   Ht 5' 4 (1.626 m)   SpO2 97%   BMI 32.61 kg/m  General:  Well developed, well nourished, no acute distress  Psych:  Alert and orientedx3, flat mood and affect HEENT:  Normocephalic, atraumatic, non-icteric sclera,  supple neck without adenopathy, mass or thyromegaly Cardiovascular:  Normal S1, S2, RRR without gallop, rub or murmurYes that is Respiratory:  Good breath sounds bilaterally, CTAB with normal respiratory effort Gastrointestinal: normal bowel sounds, soft, non-tender, no noted masses. No HSM well-healed surgical scars MSK: extremities without edema, joints without erythema or swelling Neurologic:    Mental status is normal.  Gross motor and sensory exams are normal.  No tremor  Commons side effects, risks, benefits, and alternatives for medications and treatment plan prescribed today were discussed, and the patient expressed understanding of the given instructions. Patient is instructed to call or message via MyChart if he/she has any questions or concerns regarding our treatment plan. No barriers to understanding were identified. We discussed Red Flag symptoms and signs in detail. Patient expressed understanding regarding what to do in case of urgent or emergency type  symptoms.  Medication list was reconciled, printed and provided to the patient in AVS. Patient instructions and summary information was reviewed with the patient as documented in the AVS. This note was prepared with assistance of Dragon voice recognition software. Occasional wrong-word or sound-a-like substitutions may have occurred due to the inherent limitations of voice recognition software

## 2023-12-12 ENCOUNTER — Other Ambulatory Visit (HOSPITAL_COMMUNITY): Payer: Self-pay

## 2023-12-12 DIAGNOSIS — Z5111 Encounter for antineoplastic chemotherapy: Secondary | ICD-10-CM | POA: Diagnosis not present

## 2023-12-12 DIAGNOSIS — C55 Malignant neoplasm of uterus, part unspecified: Secondary | ICD-10-CM | POA: Diagnosis not present

## 2023-12-12 MED ORDER — PROCHLORPERAZINE MALEATE 10 MG PO TABS
10.0000 mg | ORAL_TABLET | Freq: Four times a day (QID) | ORAL | 3 refills | Status: AC | PRN
  Filled 2023-12-12: qty 20, 5d supply, fill #0
  Filled 2023-12-22: qty 20, 5d supply, fill #1
  Filled 2024-01-19: qty 20, 5d supply, fill #2
  Filled 2024-02-02: qty 20, 5d supply, fill #3

## 2023-12-12 MED ORDER — DEXAMETHASONE 4 MG PO TABS
8.0000 mg | ORAL_TABLET | Freq: Every day | ORAL | 1 refills | Status: DC
  Filled 2023-12-12: qty 30, 15d supply, fill #0
  Filled 2023-12-22 – 2023-12-24 (×2): qty 30, 15d supply, fill #1

## 2023-12-12 MED ORDER — ONDANSETRON HCL 8 MG PO TABS
8.0000 mg | ORAL_TABLET | Freq: Three times a day (TID) | ORAL | 3 refills | Status: AC | PRN
  Filled 2023-12-12: qty 20, 7d supply, fill #0
  Filled 2023-12-22: qty 20, 7d supply, fill #1
  Filled 2024-01-19: qty 20, 7d supply, fill #2
  Filled 2024-02-02: qty 20, 7d supply, fill #3

## 2023-12-13 DIAGNOSIS — Z5111 Encounter for antineoplastic chemotherapy: Secondary | ICD-10-CM | POA: Diagnosis not present

## 2023-12-13 DIAGNOSIS — C55 Malignant neoplasm of uterus, part unspecified: Secondary | ICD-10-CM | POA: Diagnosis not present

## 2023-12-16 DIAGNOSIS — C541 Malignant neoplasm of endometrium: Secondary | ICD-10-CM | POA: Diagnosis not present

## 2023-12-18 ENCOUNTER — Other Ambulatory Visit (HOSPITAL_COMMUNITY): Payer: Self-pay

## 2023-12-18 MED ORDER — HYDROMORPHONE HCL 2 MG PO TABS
2.0000 mg | ORAL_TABLET | Freq: Four times a day (QID) | ORAL | 0 refills | Status: DC | PRN
  Filled 2023-12-18: qty 5, 2d supply, fill #0

## 2023-12-22 ENCOUNTER — Other Ambulatory Visit (HOSPITAL_COMMUNITY): Payer: Self-pay

## 2023-12-23 ENCOUNTER — Other Ambulatory Visit: Payer: Self-pay

## 2023-12-25 DIAGNOSIS — C541 Malignant neoplasm of endometrium: Secondary | ICD-10-CM | POA: Diagnosis not present

## 2024-01-02 ENCOUNTER — Other Ambulatory Visit (HOSPITAL_COMMUNITY): Payer: Self-pay

## 2024-01-02 MED ORDER — OXYCODONE HCL 5 MG PO TABS
5.0000 mg | ORAL_TABLET | ORAL | 0 refills | Status: DC
  Filled 2024-01-02: qty 5, 1d supply, fill #0

## 2024-01-02 NOTE — Progress Notes (Signed)
 Roomed Pt in 25. VSS.   NCCN Distress Questionnaire  Questionnaire Response Comments  Patient declines NCCN Distress Questionnaire:        Distress Thermometer Score (0-10) (Patient-Rptd) (P) 2   Comments:     Concern Response Comments  Physical  Concerns (Patient-Rptd) (P) Fatigue    Emotional Concerns (Patient-Rptd) (P) None of the above    Social Concerns (Patient-Rptd) (P) None of the above    Practical Concerns (Patient-Rptd) (P) None of the above    Spiritual/Religious Concerns (Patient-Rptd) (P) None of the above    Other Concerns     Resources Provided      Referrals Provided

## 2024-01-02 NOTE — Progress Notes (Signed)
 C2 D1 of Paclitaxel (3hr) & Carboplatin  Cryotherapy utilized during taxol infusion.   Nursing Assessment Neurological- alert and oriented x4 Cardiopulmonary- no issues Genitourinary- no issues Gastrointestinal- no issues Skin- no new rashes, wounds, or areas of concern Psychosocial- no issues Nutritional- no issues Fatigue level- 0/10 Oral mucositis- 0/5 Pain score- 0/10  See flowsheet for falls assessment, IV assessment, and vital signs.   Access - PIV placement  - PIV placed x1 attempt by Katie RN in right forearm. PIV patient, flushed, blood return noted. PIV removed prior to d/c from OTC.   Patient has no additional therapy plan Patient has no orders for later Patient has no upcoming filgrastim shot  Patient tolerated treatment well and was discharged from treatment room.

## 2024-01-02 NOTE — Progress Notes (Signed)
 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 with PMH significant for colon adenocarcinoma (s/p resection and chemotherapy in 2013) diagnosed with high-grade carcinoma, suspected serous histology, confined to an endometrial polyp now s/p TLH, BSO on 11/15/23. Full staging with LND deferred based on initial intra-operative frozen pathology report without evidence of malignancy.   Reports onset of muscle/bony aches predominantly involving bilateral knees and SI joints/pelvis on day 2 that lasted for 2-3 days despite scheduled tylenol and ibuprofen  around the clock. Discomfort to the extent that she took a dose of dilaudid  from a prior procedure that allowed her to sleep. Ultimately, feels that the fatigue more so than aches are what kept her sedentary for day 3-5 of the chemo cycle.  From a post-op standpoint, she is recovering well overall. Has noticed lower energy, but still able to do her usual activities. Scant pink spotting noted with wiping and some sensitivity at the introitus; stopped taking ibuprofen  after noting this.   No vaginal discharge, urinary symptoms, constipation, diarrhea, nausea, vomiting, edema, dyspnea, chest pain, cough, fevers, chills, neuropathy or fatigue. No changes in appetite or significant unintentional weight changes.   On leave currently, but works part-time remotely.  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  Gynecologic Oncology History: 09/2023: Presented to primary gynecologist for postmenopausal bleeding occurring 2-3x/week since May 2025. 09/18/23: Endometrial biopsy resulted superficial fragments of highly atypical endometrial epithelium suspicious for carcinoma. - [facility] interpretation of outside EMB: Scant atypical cells present, suspicious but not  diagnostic of carcinoma.    09/19/23: Pelvic Ultrasound: notable for a 1-2 cm fibroid,  7 mm endometrial stripe 11/04/23: CT CAP without evidence of metastatic disease  11/15/23: RA-TLH, BSO, omental biopsy. Lymph node dissection deferred based on initial frozen pathology report of at most EIN without evidence of invasive carcinoma. Final report showing high-grade invasive carcinoma confined to a polyp, suspected serous histology.   12/13/23: C1D1 of carboplatin AUC6 and paclitaxel 175mg /m2  Do any family members have a history of DVT/PE: no  Genetic Risk Assessment: Per patient, completed germline testing after colon cancer diagnosis which was negative.  Molecular Tumor testing: Tumor microsatellite stable (MSS), with retained expression of MLH1, MSH2, MSH6, and PMS2. ER+ (75%, 3+), PR+ (80%, 3+), p53 mutated, POLE testing pending.    Problem List: Patient Active Problem List  Diagnosis  . Colon cancer (CMS/HHS-HCC)  . Hyperlipidemia  . Seasonal allergies  . Examination of participant in clinical trial  . GERD (gastroesophageal reflux disease)  . Uterine cancer (CMS/HHS-HCC)    Past Medical History: Past Medical History:  Diagnosis Date  . Anemia    very slight  . Carpal tunnel syndrome    s/p bilateral release  . Colon cancer (CMS/HHS-HCC) 2013  . GERD (gastroesophageal reflux disease)   . Hypercholesterolemia   . Hypertension   . Seasonal allergies   . Wears glasses     Past Surgical History: Past Surgical History:  Procedure Laterality Date  . POWER PORT  2013  . LAPAROSCOPIC COLON RESECTION  06/2011  . EGD N/A 10/02/2012   Procedure: EGD;  Surgeon: Sula Classie Fent, MD;  Location: [facility];  Service: Gastroenterology;  Laterality: N/A;  . COLONOSCOPY W/BIOPSY  10/02/2012   Procedure: COLONOSCOPY W/BIOPSY;  Surgeon: Sula Classie Fent, MD;  Location: [facility];  Service:  Gastroenterology;;  . COLONOSCOPY W/INJECTION N/A 09/28/2013    Procedure: COLONOSCOPY, FLEXIBLE; WITH DIRECTED SUBMUCOSAL INJECTION(S), ANY SUBSTANCE;  Surgeon: Paul Simon Jowell, MD;  Location: [facility];  Service: Gastroenterology;  Laterality: N/A;  . COLONOSCOPY W/REMOVAL LESIONS BY SNARE  09/28/2013   Procedure: COLONOSCOPY, FLEXIBLE; WITH REMOVAL OF TUMOR(S), POLYP(S), OR OTHER LESION(S) BY SNARE TECHNIQUE;  Surgeon: Paul Simon Jowell, MD;  Location: [facility];  Service: Gastroenterology;;  . COLONOSCOPY W/BIOPSY  09/28/2013   Procedure: COLONOSCOPY, FLEXIBLE; WITH BIOPSY, SINGLE OR MULTIPLE;  Surgeon: Deward Ray Napoleon, MD;  Location: [facility];  Service: Gastroenterology;;  . SURVEILLANCE COLONOSCOPY N/A 08/04/2015   Procedure: SURVEILLANCE COLONOSCOPY,;  Surgeon: Deward Ray Napoleon, MD;  Location: [facility];  Service: Gastroenterology;  Laterality: N/A;  . COLONOSCOPY N/A 10/20/2018   Procedure: SURVEILLANCE COLONOSCOPY;  Surgeon: Jowell, Paul Simon, MD;  Location: [facility];  Service: Gastroenterology;  Laterality: N/A;  . COLONOSCOPY N/A 10/02/2021   Procedure: COLORECTAL CANCER SCREENING; COLONOSCOPY ON INDIVIDUAL AT HIGH RISK;  Surgeon: Jowell, Paul Simon, MD;  Location: [facility];  Service: Gastroenterology;  Laterality: N/A;  . ROBOT ASSISTED LAPAROSCOPIC SURGERY WITH TOTAL HYSTERECTOMY Bilateral 11/15/2023   Procedure: *ROBOT* ASSISTED LAPAROSCOPY, SURGICAL; WITH TOTAL HYSTERECTOMY, FOR UTERUS 250 G OR LESS; WITH REMOVAL OF TUBE(S) AND / OR OVARY(S);  Surgeon: Kristene Laymon Dragon, MD;  Location: [facility];  Service: Gynecology;  Laterality: Bilateral;  . PELVIC EXAMINATION UNDER ANESTHESIA N/A 11/15/2023   Procedure: PELVIC EXAMINATION UNDER ANESTHESIA (OTHER THAN LOCAL);  Surgeon: Kristene Laymon Dragon, MD;  Location: [facility];  Service: Gynecology;  Laterality: N/A;  . COLONOSCOPY     several  . EGD     several  . ENDOSCOPIC CARPAL TUNNEL RELEASE Bilateral    . EXCISION GANGLION CYST WRIST PRIMARY Left      Past Gynecologic History:   Menarche: 14  Menstrual details: Lasts 7 days, moderate flow  Menses regular: yes  Last Menstrual Period: 04/2012 - around time on chemo History of OCP/HRT use:  no History of Abnormal pap: no,   Last pap: 07/2023, NILM. HPV not done  HPV vaccine: not applicable History of STDs: The patient denies history of sexually transmitted disease.  Contraception: none  Sexually active: yes  OB History:  OB History  No obstetric history on file.    Family History: Family History  Problem Relation Age of Onset  . Lung cancer Maternal Grandmother   . Colon cancer Paternal Grandmother   . Anesthesia problems Neg Hx     Social History: Social History   Socioeconomic History  . Marital status: Married    Spouse name: Monica Hy  . Number of children: 0  Occupational History  . Occupation: Charity fundraiser  Tobacco Use  . Smoking status: Former    Current packs/day: 0.00    Average packs/day: 0.5 packs/day for 22.0 years (11.0 ttl pk-yrs)    Types: Cigarettes    Start date: 10/08/1986    Quit date: 10/07/2008    Years since quitting: 15.2  . Smokeless tobacco: Never  Vaping Use  . Vaping status: Never Used  Substance and Sexual Activity  . Alcohol use: Yes    Comment: very rare social use  . Drug use: No  . Sexual activity: Yes    Partners: Male   Social Drivers of Health   Financial Resource Strain: Low Risk  (12/09/2023)   Received from [facility]   Overall Financial Resource Strain (CARDIA)   .  How hard is it for you to pay for the very basics like food, housing, medical care, and heating?: Not hard at all  Food Insecurity: No Food Insecurity (12/09/2023)   Received from [facility]   Hunger Vital Sign   . Within the past 12 months, you worried that your food would run out before you got the money to buy more.: Never true   . Within the past 12 months, the food you bought just didn't last and you didn't  have money to get more.: Never true  Transportation Needs: No Transportation Needs (12/09/2023)   Received from [facility] - Transportation   . In the past 12 months, has lack of transportation kept you from medical appointments or from getting medications?: No   . In the past 12 months, has lack of transportation kept you from meetings, work, or from getting things needed for daily living?: No  Physical Activity: Insufficiently Active (12/09/2023)   Received from [facility]   Exercise Vital Sign   . On average, how many days per week do you engage in moderate to strenuous exercise (like a brisk walk)?: 4 days   . On average, how many minutes do you engage in exercise at this level?: 30 min  Stress: Stress Concern Present (12/09/2023)   Received from [facility] - Occupational Stress Questionnaire   . Do you feel stress - tense, restless, nervous, or anxious, or unable to sleep at night because your mind is troubled all the time - these days?: Rather much  Social Connections: Moderately Integrated (12/09/2023)   Received from [facility]   Social Connection and Isolation Panel   . In a typical week, how many times do you talk on the phone with family, friends, or neighbors?: Three times a week   . How often do you get together with friends or relatives?: Once a week   . How often do you attend church or religious services?: 1 to 4 times per year   . Do you belong to any clubs or organizations such as church groups, unions, fraternal or athletic groups, or school groups?: No   . Are you married, widowed, divorced, separated, never married, or living with a partner?: Married  Housing Stability: Unknown (06/03/2023)   Housing Stability Vital Sign   . Homeless in the Last Year: No    Allergies: Allergies  Allergen Reactions  . Amlodipine  Swelling    LEE  . Erythromycin Nausea and Vomiting    Current Medications: Current Outpatient  Medications  Medication Sig Dispense Refill  . ALPRAZolam  (XANAX ) 0.5 MG tablet Take 0.25 mg by mouth at bedtime as needed for Sleep    . atorvastatin  (LIPITOR) 10 MG tablet Take 10 mg by mouth once daily    . carvediloL  (COREG ) 6.25 MG tablet Take 6.25 mg by mouth 2 (two) times daily    . cholecalciferol, vitamin D3, (VITAMIN D3) 125 mcg (5,000 unit) tablet Take 5,000 Units by mouth once daily    . cyanocobalamin  (VITAMIN B12) 1000 MCG tablet Take 1,000 mcg by mouth twice a week    . dexAMETHasone  (DECADRON ) 4 MG tablet Take 2 tablets (8 mg) by mouth daily with food in the morning for 3 days following chemotherapy. 30 tablet 1  . fenofibrate  nanocrystallized (TRICOR ) 145 MG tablet Take 145 mg by mouth once daily    . ferrous sulfate 142 mg (45 mg iron) TbER Take 1 tablet by  mouth once daily Slow FE    . HYDROmorphone  (DILAUDID ) 2 MG tablet Take 1 tablet (2 mg total) by mouth every 6 (six) hours as needed for Pain 5 tablet 0  . methocarbamoL  (ROBAXIN ) 750 MG tablet Take 750 mg by mouth once daily as needed (Lower Back Pain - Muscle Spasms)    . omeprazole (PRILOSEC OTC) 20 MG EC tablet Take 20 mg by mouth once daily    . ondansetron  (ZOFRAN ) 8 MG tablet Take 1 tablet (8 mg total) by mouth every 8 (eight) hours as needed for Nausea or Vomiting 20 tablet 3  . prochlorperazine  (COMPAZINE ) 10 MG tablet Take 10mg  (1 tablet) by mouth every 6 hours as needed for nausea if nausea not controlled. 20 tablet 3  . acetaminophen (TYLENOL) 500 MG tablet Take 500 mg by mouth as needed for Pain    . aspirin 81 MG EC tablet Take 81 mg by mouth nightly.  (Patient not taking: Reported on 01/02/2024)    . Herbal Supplement Take 1 each by mouth at bedtime as needed (Sleep) Herbal Name: CBD Gummy     No current facility-administered medications for this visit.    Review of Systems As per history, otherwise 12 point review of systems negative.    Objective:  Physical Examination:  BP (!) 132/93 (BP Location:  Right upper arm)   Pulse 70   Temp 37.4 C (99.3 F) (Oral)   Wt 86.2 kg (190 lb 0.6 oz)   LMP 06/08/2015   SpO2 96%   BMI 32.85 kg/m    ECOG Performance Status:0 Fully active; no performance restrictions  General Appearance / Mental Status: well appearing in no acute distress / normal mood and affect Neck : no cervical lymphadenopathy Lymphatic survey: non-palpable supraclavicular, submandibular , anterior cervical  Cardiovascular: regular rate and rhythm and grade 1 systolic murmur Respiratory: clear to auscultation Gastrointestinal: soft, nontender, nondistended. No palpable masses. Well-healing laparoscopic incisions with overlying scabs, no palpable suture.  Musculoskeletal: No midline or paraspinal TTP; no lower extremity edema Skin: normal coloration and turgor, no rashes, no suspicious skin lesions noted.  Pelvic exam:   Exam chaperoned by RN; External genitalia: normal, without lesion and mucosal pallor around urethra suggestive of mild atrophy, normal architecture of the vulva Urethra/Bladder: normal  Vagina: normal without lesions or abnormal discharge. Well healing vaginal cuff without visible suture, granulation tissue, or any active bleeding. Normal to palpation without nodularity though with palpable suture.  Cervix: absent Uterus: absent Adnexa: nontender, no masses  Rectal: deferred  Lab Review  Lab Results  Component Value Date   WBC 5.0 01/02/2024   HGB 11.7 01/02/2024   HCT 34.5 (L) 01/02/2024   MCV 87 01/02/2024   PLT 226 01/02/2024   NEUTCT 2.6 01/02/2024   Lab Results  Component Value Date   NA 137 01/02/2024   K 4.2 01/02/2024   CL 106 01/02/2024   CO2 22 01/02/2024   Lab Results  Component Value Date   BUN 14 01/02/2024   CREATININE 0.7 01/02/2024   Lab Results  Component Value Date   ALT 49 (H) 01/02/2024   AST 32 01/02/2024   ALKPHOS 83 01/02/2024   BILITOT 0.2 07/22/2011   ALB 3.9 01/02/2024   No results found for: IRON,  TIBC, RETICHE    Radiologic Imaging: 11/04/23: CT CAP Impression: No evidence of metastatic disease.   Assessment:  Nicole Rosario is a 58 y.o.  with stage IA high-grade endometrial carcinoma (serous favored; p53 mut, pMMR,  POLE pending) confined to endometrial polyp s/p RA-TLH, BSO, omental biopsy on 11/15/23. Of note, SLN biopsies deferred intra-op given frozen pathology suggestive of at most EIN without evidence of invasive carcinoma. Following extensive discussion of treatment options, patient felt strongly that pursuing systemic chemotherapy was concordant with her goals. Received C1D1 on 12/13/23. Presents today for consideration of continuing therapy.   Medical co-morbidities complicating care: history of colon cancer and prior colectomy. Body mass index is 32.85 kg/m.  Plan:   Problem List Items Addressed This Visit       Oncology   Uterine cancer (CMS/HHS-HCC)   Relevant Medications   oxyCODONE  (ROXICODONE ) 5 MG immediate release tablet   Other Visit Diagnoses       Encounter for antineoplastic chemotherapy    -  Primary       Stage IA endometrial cancer - Adjuvant chemotherapy with carboplatin AUC 6 and paclitaxel 175 mg/m2. Plan for 6 cycles. Consideration of cycle # 2. Labs are adequate to proceed with treatment. Dose adjustments not indicated.    Prior to initiation of therapy, extensive discussion regarding pathology report, operative findings, role of adjuvant therapy as well as tumor board recommendations. Ultimately, patient has opted to proceed with adjuvant chemotherapy given nodes were not done as frozen did not demonstrate cancer, with low threshold to decrease dose or alter duration of treatment plan pending tolerance of therapy given prior chemotherapy.   - Recovering well from a post-operative standpoint.  - Reassuring pelvic exam with well-healing vaginal cuff, no granulation tissue or active bleeding   - Reviewed gradual liberalization of activity, though  nothing in the vagina for 8-10 weeks post-op.   Side effects of treatment/symptoms of disease:  Changes in supportive medications not indicated  - Neuropathy: Fingers and toes in bilateral hands/feet with nearly full resolution at this time, with pinpoint sensory changes at fingertips. No fine motor defects. Will continue to monitor   - Arthralgias: Recommend scheduled tylenol and ibuprofen  days 3-5 after infusion, with addition of anti-histamine for several days starting D1. Will send limited Rx for oxycodone  for breakthrough pain, though counseled patient on sparing use and need for stool softeners  Chronic diseases/conditions contributing to complexity of care: History of colon cancer s/p resection and chemotherapy; Body mass index is 32.85 kg/m.  Response to treatment: not yet able to be assessed.    Imaging: Last study completed 11/04/23 (prior to surgery). Plan for repeat imaging to establish post-treatment baseline.   VTE prophylaxis: Gynecologic malignancy and Score 1 no VTE prophylaxis indicated  Nutrition screen: no evidence of malnutrition at this time.. Nutrition consult not indicated at this time.  Genetic testing screen: Completed following colon cancer diagnosis (2013) and negative per patient  Treatment schedule: Return to clinic in 3 weeks for consideration of the next cycle.  Goals of care: not indicated currently.  Treatment options as well as risks and benefits were discussed with the patient, Nicole Rosario desires to continue the current plan.   [phone] Nicole Richmond, MD  ([id number]) Gynecologic Oncology Fellow  [facility]   Attestation Statement:   I personally saw and evaluated the patient, and participated in the management and treatment plan as documented in the resident/fellow note.  LAYMON Arlean BUNDE, MD

## 2024-01-09 ENCOUNTER — Emergency Department (HOSPITAL_COMMUNITY)

## 2024-01-09 ENCOUNTER — Emergency Department (HOSPITAL_COMMUNITY)
Admission: EM | Admit: 2024-01-09 | Discharge: 2024-01-09 | Disposition: A | Discharge status: Home / Self Care | Attending: Emergency Medicine | Admitting: Emergency Medicine

## 2024-01-09 ENCOUNTER — Other Ambulatory Visit: Payer: Self-pay

## 2024-01-09 ENCOUNTER — Encounter (HOSPITAL_COMMUNITY): Payer: Self-pay | Admitting: *Deleted

## 2024-01-09 ENCOUNTER — Other Ambulatory Visit (HOSPITAL_COMMUNITY): Payer: Self-pay

## 2024-01-09 DIAGNOSIS — M25552 Pain in left hip: Secondary | ICD-10-CM | POA: Diagnosis not present

## 2024-01-09 DIAGNOSIS — Z79899 Other long term (current) drug therapy: Secondary | ICD-10-CM | POA: Diagnosis not present

## 2024-01-09 DIAGNOSIS — Z85038 Personal history of other malignant neoplasm of large intestine: Secondary | ICD-10-CM | POA: Diagnosis not present

## 2024-01-09 DIAGNOSIS — M5127 Other intervertebral disc displacement, lumbosacral region: Secondary | ICD-10-CM | POA: Diagnosis not present

## 2024-01-09 DIAGNOSIS — M48061 Spinal stenosis, lumbar region without neurogenic claudication: Secondary | ICD-10-CM | POA: Diagnosis not present

## 2024-01-09 DIAGNOSIS — M5136 Other intervertebral disc degeneration, lumbar region with discogenic back pain only: Secondary | ICD-10-CM | POA: Diagnosis not present

## 2024-01-09 DIAGNOSIS — M545 Low back pain, unspecified: Secondary | ICD-10-CM | POA: Insufficient documentation

## 2024-01-09 DIAGNOSIS — S79912A Unspecified injury of left hip, initial encounter: Secondary | ICD-10-CM | POA: Diagnosis not present

## 2024-01-09 DIAGNOSIS — M47816 Spondylosis without myelopathy or radiculopathy, lumbar region: Secondary | ICD-10-CM | POA: Diagnosis not present

## 2024-01-09 DIAGNOSIS — M4800 Spinal stenosis, site unspecified: Secondary | ICD-10-CM

## 2024-01-09 DIAGNOSIS — Z7982 Long term (current) use of aspirin: Secondary | ICD-10-CM | POA: Diagnosis not present

## 2024-01-09 DIAGNOSIS — R200 Anesthesia of skin: Secondary | ICD-10-CM | POA: Diagnosis not present

## 2024-01-09 DIAGNOSIS — M4807 Spinal stenosis, lumbosacral region: Secondary | ICD-10-CM | POA: Diagnosis not present

## 2024-01-09 DIAGNOSIS — Z043 Encounter for examination and observation following other accident: Secondary | ICD-10-CM | POA: Diagnosis not present

## 2024-01-09 DIAGNOSIS — M5442 Lumbago with sciatica, left side: Secondary | ICD-10-CM | POA: Diagnosis not present

## 2024-01-09 LAB — COMPREHENSIVE METABOLIC PANEL WITH GFR
ALT: 41 U/L (ref 0–44)
AST: 26 U/L (ref 15–41)
Albumin: 4.6 g/dL (ref 3.5–5.0)
Alkaline Phosphatase: 81 U/L (ref 38–126)
Anion gap: 16 — ABNORMAL HIGH (ref 5–15)
BUN: 30 mg/dL — ABNORMAL HIGH (ref 6–20)
CO2: 24 mmol/L (ref 22–32)
Calcium: 9.8 mg/dL (ref 8.9–10.3)
Chloride: 99 mmol/L (ref 98–111)
Creatinine, Ser: 1.02 mg/dL — ABNORMAL HIGH (ref 0.44–1.00)
GFR, Estimated: >60 mL/min (ref >60)
Glucose, Bld: 106 mg/dL — ABNORMAL HIGH (ref 70–99)
Potassium: 4 mmol/L (ref 3.5–5.1)
Sodium: 139 mmol/L (ref 135–145)
Total Bilirubin: 0.4 mg/dL (ref 0.0–1.2)
Total Protein: 7.2 g/dL (ref 6.5–8.1)

## 2024-01-09 LAB — CBC WITH DIFFERENTIAL/PLATELET
Abs Immature Granulocytes: 0.03 K/uL (ref 0.00–0.07)
Basophils Absolute: 0 K/uL (ref 0.0–0.1)
Basophils Relative: 0 %
Eosinophils Absolute: 0.4 K/uL (ref 0.0–0.5)
Eosinophils Relative: 6 %
HCT: 38.4 % (ref 36.0–46.0)
Hemoglobin: 13.3 g/dL (ref 12.0–15.0)
Immature Granulocytes: 1 %
Lymphocytes Relative: 32 %
Lymphs Abs: 1.8 K/uL (ref 0.7–4.0)
MCH: 29.4 pg (ref 26.0–34.0)
MCHC: 34.6 g/dL (ref 30.0–36.0)
MCV: 85 fL (ref 80.0–100.0)
Monocytes Absolute: 0.1 K/uL (ref 0.1–1.0)
Monocytes Relative: 2 %
Neutro Abs: 3.3 K/uL (ref 1.7–7.7)
Neutrophils Relative %: 59 %
Platelets: 252 K/uL (ref 150–400)
RBC: 4.52 MIL/uL (ref 3.87–5.11)
RDW: 12.2 % (ref 11.5–15.5)
WBC: 5.6 K/uL (ref 4.0–10.5)
nRBC: 0 % (ref 0.0–0.2)

## 2024-01-09 MED ORDER — OXYCODONE HCL 10 MG PO TABS
5.0000 mg | ORAL_TABLET | Freq: Four times a day (QID) | ORAL | 0 refills | Status: AC | PRN
  Filled 2024-01-09: qty 6, 3d supply, fill #0

## 2024-01-09 MED ORDER — HYDROMORPHONE HCL 1 MG/ML IJ SOLN
0.5000 mg | Freq: Once | INTRAMUSCULAR | Status: AC
  Administered 2024-01-09: 0.5 mg via INTRAVENOUS
  Filled 2024-01-09: qty 1

## 2024-01-09 MED ORDER — METHYLPREDNISOLONE 4 MG PO TBPK
ORAL_TABLET | ORAL | 0 refills | Status: DC
  Filled 2024-01-09: qty 21, 6d supply, fill #0

## 2024-01-09 MED ORDER — HYDROMORPHONE HCL 1 MG/ML IJ SOLN
0.5000 mg | INTRAMUSCULAR | Status: AC | PRN
  Administered 2024-01-09 (×2): 0.5 mg via INTRAVENOUS
  Filled 2024-01-09 (×2): qty 1

## 2024-01-09 MED ORDER — METHYLPREDNISOLONE SODIUM SUCC 125 MG IJ SOLR
125.0000 mg | Freq: Once | INTRAMUSCULAR | Status: AC
  Administered 2024-01-09: 125 mg via INTRAVENOUS
  Filled 2024-01-09: qty 2

## 2024-01-09 MED ORDER — LORAZEPAM 2 MG/ML IJ SOLN
0.5000 mg | Freq: Once | INTRAMUSCULAR | Status: AC
  Administered 2024-01-09: 0.5 mg via INTRAVENOUS
  Filled 2024-01-09: qty 1

## 2024-01-09 MED ORDER — SODIUM CHLORIDE 0.9 % IV BOLUS
1000.0000 mL | Freq: Once | INTRAVENOUS | Status: AC
  Administered 2024-01-09: 1000 mL via INTRAVENOUS

## 2024-01-09 NOTE — Discharge Instructions (Addendum)
 Your MRI shows central spinal canal and bilateral lateral recess stenosis with some nerve impingement, this is likely contributing to your symptoms today.  Continue Tylenol/ibuprofen , you may start oxycodone  every 6 hours as needed for breakthrough pain.  Start Medrol Dosepak, use as indicated on packaging.  Contact Dr. Colon (neurosurgeon) to schedule follow-up since you have previously been seen/evaluated by him in the past.  Return to the emergency department if your symptoms worsen.

## 2024-01-09 NOTE — ED Notes (Signed)
 Pt cleared for po fluids by PA. Water provider per pt request

## 2024-01-09 NOTE — ED Provider Notes (Signed)
 [place] EMERGENCY DEPARTMENT AT [facility] Provider Note   CSN: [id number] Arrival date & time: 01/09/24  0125     Patient presents with: Hip Pain   Nicole Rosario is a 58 y.o. female.   Patient with history of endometrial carcinoma s/p hysterectomy currently on chemotherapy presents today with complaints of back pain, left hip pain. She reports that she slipped 1 week ago and caught herself with her left leg and felt a popping sensation followed by pain. She did not actually fall during this event. She then went to have her chemo infusion the following day, states that after that her hip pain improved, however she reports she gets steroids for 2 days after her infusion and she suspects this is why her pain was better. After she stopped getting the steroids, her pain returned. Reports that this is her second infusion of this chemo cycle, and states that after the first infusion she did experience significant joint and muscle pain and had to take leftover dilaudid  she had for her hysterectomy to manage. For this episode, she took her home Roxicodone  and flexeril  but did not have any relief and therefore presents for evaluation. Reports that she did have a microdiscectomy in her lumbar spine in the 1990s. Reports she has had some numbness in her left foot and ankle, however notes she had this with her previous chemo infusion as well as when she had her lumbar surgery before. Denies any weakness in her extremities. No abdominal pain, nausea, vomiting, or diarrhea. No fevers or chills.  No loss of bowel or bladder function or saddle anesthesia. Reports her pain is in her lumbar spine and shoots down into her left buttock. Reports she is struggling to find a position of comfort and constantly changes position throughout evaluation.   The history is provided by the patient. No language interpreter was used.  Hip Pain       Prior to Admission medications   Medication Sig Start Date End  Date Taking? Authorizing Provider  ALPRAZolam  (XANAX ) 0.5 MG tablet Take 1 tablet (0.5 mg total) by mouth daily as needed for anxiety. 11/11/23   Jodie Lavern CROME, MD  aspirin EC 81 MG tablet Take 1 tablet by mouth daily.    [provider]  atorvastatin  (LIPITOR) 10 MG tablet Take 1 tablet (10 mg total) by mouth at bedtime. 11/11/23   Jodie Lavern CROME, MD  carvedilol  (COREG ) 6.25 MG tablet Take 1 tablet (6.25 mg total) by mouth 2 (two) times daily. 05/27/23   Tobb, Kardie, DO  Cholecalciferol (VITAMIN D3) 125 MCG (5000 UT) TABS Take 5,000 Units by mouth daily.    [provider]  cyanocobalamin  (VITAMIN B12) 1000 MCG tablet Take 1,000 mcg by mouth 2 (two) times a week.    [provider]  dexamethasone  (DECADRON ) 4 MG tablet Take 2 tablets (8 mg) by mouth daily with food in the morning for 3 days following chemotherapy. 12/12/23     fenofibrate  (TRICOR ) 145 MG tablet Take 1 tablet (145 mg total) by mouth daily. 08/20/23   Tobb, Kardie, DO  ferrous sulfate (SLOW RELEASE IRON) 160 (50 Fe) MG TBCR SR tablet Take 160 mg by mouth daily.    [provider]  HYDROmorphone  (DILAUDID ) 2 MG tablet Take 1 tablet (2 mg total) by mouth every 6 (six) hours as needed for Pain 12/18/23     hyoscyamine  (LEVSIN  SL) 0.125 MG SL tablet Place 1 tablet (0.125 mg total) under the tongue  every 4 (four) hours as needed. 12/11/23   Jodie Lavern CROME, MD  methocarbamol  (ROBAXIN ) 750 MG tablet Take 1 tablet (750 mg total) by mouth 3 (three) times daily as needed for muscle spasms. 11/11/23   Jodie Lavern CROME, MD  ondansetron  (ZOFRAN ) 8 MG tablet Take 1 tablet (8 mg total) by mouth every 8 (eight) hours as needed for Nausea or Vomiting 12/12/23     oxyCODONE  (OXY IR/ROXICODONE ) 5 MG immediate release tablet Take 1 tablet (5 mg total) by mouth every 4 (four) hours as needed for Pain for up to 5 days 01/02/24     prochlorperazine  (COMPAZINE ) 10 MG tablet Take 1 tablet by mouth every 6 hours as needed for nausea if  nausea not controlled. 12/12/23       Allergies: Erythromycin and Amlodipine     Review of Systems  Musculoskeletal:  Positive for arthralgias, back pain and myalgias.  All other systems reviewed and are negative.   Updated Vital Signs BP 131/85   Pulse 94   Temp 99 F (37.2 C)   Resp 16   SpO2 96%   Physical Exam Vitals and nursing note reviewed.  Constitutional:      General: She is not in acute distress.    Appearance: Normal appearance. She is normal weight. She is not ill-appearing, toxic-appearing or diaphoretic.     Comments: Uncomfortable appearing  HENT:     Head: Normocephalic and atraumatic.  Cardiovascular:     Rate and Rhythm: Normal rate.  Pulmonary:     Effort: Pulmonary effort is normal. No respiratory distress.  Musculoskeletal:        General: Normal range of motion.     Cervical back: Normal range of motion.     Comments: No midline tenderness to palpation of the cervical, thoracic, or lumbar spine.  No step-offs, lesions, deformity, or overlying skin changes.  5/5 strength intact to bilateral upper and lower extremities.  Ambulatory with steady gait.  Subjective numbness noted to the left foot.  DP and PT pulses are intact and 2+.  Skin:    General: Skin is warm and dry.  Neurological:     General: No focal deficit present.     Mental Status: She is alert.  Psychiatric:        Mood and Affect: Mood normal.        Behavior: Behavior normal.     (all labs ordered are listed, but only abnormal results are displayed) Labs Reviewed  COMPREHENSIVE METABOLIC PANEL WITH GFR - Abnormal; Notable for the following components:      Result Value   Glucose, Bld 106 (*)    BUN 30 (*)    Creatinine, Ser 1.02 (*)    Anion gap 16 (*)    All other components within normal limits  CBC WITH DIFFERENTIAL/PLATELET    EKG: None  Radiology: CT Lumbar Spine Wo Contrast Result Date: 01/09/2024 CLINICAL DATA:  . fell yesterday. EXAM: CT LUMBAR SPINE WITHOUT  CONTRAST TECHNIQUE: Multidetector CT imaging of the lumbar spine was performed without intravenous contrast administration. Multiplanar CT image reconstructions were also generated. RADIATION DOSE REDUCTION: This exam was performed according to the departmental dose-optimization program which includes automated exposure control, adjustment of the mA and/or kV according to patient size and/or use of iterative reconstruction technique. COMPARISON:  AP Lat lumbar spine films 11/15/2022. FINDINGS: Segmentation: Standard Alignment: Straightened, without evidence of listhesis or scoliosis. Vertebrae: No acute fracture or focal pathologic process. There is mild lumbar spondylosis. More  prominent marginal osteophytes in the visualized lower thoracic spine. The lumbar pedicles are congenitally short which reduces the effective AP thecal sac diameter. Paraspinal and other soft tissues: No paraspinal mass, hematoma or fluid collection. Mild aortic atherosclerosis. No AAA. Disc levels: T10-11 through T12-L1: The discs are partially degenerated. No herniation or canal stenosis is seen. Posterior bridging osteophytes at T12-L1 flatten the ventral thecal sac without causing spinal stenosis. The foramina are patent. Slight facet spurring noted all 3 levels. L1-2: Normal disc height. No significant disc bulge. No herniation or stenosis. Trace facet spurs. L2-3: Normal disc height. Small dorsal epidural fat accumulation. 7 mm AP thecal sac measurement due to short pedicles, dorsal epidural fat and mild generalized annular bulge. There is no herniation or subarticular zone stenosis. There are mild facet spurs without foraminal stenosis. L3-4: There is slight disc space loss. There is diffuse annular bulge, with 5.2 mm AP thecal sac stenosis owing to the annular bulge, dorsal epidural fat and short pedicles. There is moderate encroachment on the subarticular zones. There are mild facet spurs and bilateral mild foraminal stenosis. L4-5:  Normal disc height. Diffuse disc bulge. Moderate to severe acquired spinal stenosis is seen due to short pedicles, ligamentous and facet hypertrophy and annular bulging, with effaced bilateral subarticular zones. There is mild foraminal stenosis. L5-S1: Mild disc space loss. Left paracentral disc protrusion noted with a least mild mass effect on the left S1 nerve root. This is superimposed on a circumferential annular bulge causing additional mass effect on the right S1 nerve root, with facet hypertrophy also contributing. There is bilateral mild-to-moderate foraminal stenosis. Other: Both SI joints are patent with spurring and vacuum phenomenon. IMPRESSION: 1. No evidence of lumbar fractures. 2. Congenitally short pedicles with acquired spinal stenosis at L2-3, L3-4 and greatest at L4-5. 3. Left paracentral disc protrusion at L5-S1 with a least mild mass effect on the left S1 nerve root. 4. Additional mass effect on the right S1 nerve root at L5-S1 due to annular bulging and facet hypertrophy. 5. Bilateral mild-to-moderate foraminal stenosis at L5-S1. Mild foraminal stenosis L3-4 and L4-5. 6. Aortic atherosclerosis. Aortic Atherosclerosis (ICD10-I70.0). Electronically Signed   By: Francis Quam M.D.   On: 01/09/2024 03:00   DG Hip Unilat W or Wo Pelvis 2-3 Views Left Result Date: 01/09/2024 CLINICAL DATA:  Fall with left hip injury. EXAM: DG HIP (WITH OR WITHOUT PELVIS) 2-3V LEFT COMPARISON:  None Available. FINDINGS: Three views with AP pelvis and two views left hip. There is no evidence of hip fracture or dislocation. There are enthesopathic changes of the pelvis and greater trochanters. There is no evidence of arthropathy or other focal bone abnormality. IMPRESSION: No evidence of hip fracture or dislocation. Enthesopathic changes of the pelvis and greater trochanters. Electronically Signed   By: Francis Quam M.D.   On: 01/09/2024 02:43     Procedures   Medications Ordered in the ED  LORazepam  (ATIVAN) injection 0.5 mg (0 mg Intravenous Hold 01/09/24 0401)  HYDROmorphone  (DILAUDID ) injection 0.5 mg (0.5 mg Intravenous Given 01/09/24 0522)  HYDROmorphone  (DILAUDID ) injection 0.5 mg (0.5 mg Intravenous Given 01/09/24 0216)  HYDROmorphone  (DILAUDID ) injection 0.5 mg (0.5 mg Intravenous Given 01/09/24 0318)  sodium chloride 0.9 % bolus 1,000 mL (1,000 mLs Intravenous New Bag/Given 01/09/24 0359)  methylPREDNISolone sodium succinate (SOLU-MEDROL) 125 mg/2 mL injection 125 mg (125 mg Intravenous Given 01/09/24 0401)  Medical Decision Making Amount and/or Complexity of Data Reviewed Labs: ordered. Radiology: ordered.  Risk Prescription drug management.   This patient is a 58 y.o. female who presents to the ED for concern of back pain, left hip pain, this involves an extensive number of treatment options, and is a complaint that carries with it a high risk of complications and morbidity. The emergent differential diagnosis prior to evaluation includes, but is not limited to,  Fracture (acute/chronic), muscle strain, cauda equina, spinal stenosis, DDD, metastatic cancer, vertebral osteomyelitis, kidney stone, pyelonephritis, AAA, pancreatitis, bowel obstruction, meningitis.  This is not an exhaustive differential.   Past Medical History / Co-morbidities / Social History:  has a past medical history of Cholelithiasis (04/22/2019), Colon cancer Stage III at splenic flexure; followed at [facility]; s/p surgery and chemo. (04/09/2012), Fatty liver (09/18/2017), GERD (gastroesophageal reflux disease), Hyperlipidemia (10/08/2011), Obesity (BMI 30-39.9) (09/18/2017), Positive TB test, Seasonal allergies (10/08/2011), and Vasomotor symptoms due to menopause (09/18/2017).  Additional history: Chart reviewed. Pertinent results include: follows with gyn/onc at [facility] for her endometrial cancer, last chemo infusion was 9/25  Physical Exam: Physical exam performed. The pertinent  findings include: patient uncomfortable appearing, no midline tenderness to palpation of the cervical, thoracic, or lumbar spine.  No step-offs, lesions, deformity, or overlying skin changes.  5/5 strength intact to bilateral upper and lower extremities.  Ambulatory with steady gait.  Subjective numbness noted to the left foot.  DP and PT pulses are intact and 2+.  Lab Tests: I ordered, and personally interpreted labs.  The pertinent results include:  creatinine 1.02, BUN 30 up from 0.7 and 14 1 week ago. Anion gap 16   Imaging Studies: I ordered imaging studies including DG left hip, Ct lumbar spine, MRI lumbar spine. I independently visualized and interpreted imaging which showed   DG left hip:  No evidence of hip fracture or dislocation. Enthesopathic changes of the pelvis and greater trochanters.  CT:  1. No evidence of lumbar fractures. 2. Congenitally short pedicles with acquired spinal stenosis at L2-3, L3-4 and greatest at L4-5. 3. Left paracentral disc protrusion at L5-S1 with a least mild mass effect on the left S1 nerve root. 4. Additional mass effect on the right S1 nerve root at L5-S1 due to annular bulging and facet hypertrophy. 5. Bilateral mild-to-moderate foraminal stenosis at L5-S1. Mild foraminal stenosis L3-4 and L4-5. 6. Aortic atherosclerosis.  I agree with the radiologist interpretation.   MRI is pending at shift change   Medications: I ordered medication including dilaudid , ativan, solu-medrol, fluids  for pain, dehydration. Reevaluation of the patient after these medicines showed that the patient improved. I have reviewed the patients home medicines and have made adjustments as needed.   Disposition:  Patients MRI is pending at shift change and will determine dispo. If negative, plan for reassessment to determine pain control, d/c with steroids and pain medication.   Care handoff to [facility], PA-C at shift change. Please see their note for continued  evaluation and dispo  This is a shared visit with supervising physician Dr. Griselda who has independently evaluated patient & provided guidance in evaluation/management/disposition, in agreement with care   Final diagnoses:  None    ED Discharge Orders     None          Nicole Rosario 01/09/24 2207    Griselda Norris, MD 01/14/24 2306

## 2024-01-09 NOTE — ED Triage Notes (Signed)
 Pt reports left hip/buttocks pain severely for about 12 hours (started on Saturday afternoon). Pt reports she slipped on Wednesday and she feels like she caught her weight on the left leg. She reports she is also on chemo and it does cause some issue with her nerves/muscles/joints. Numbness in the bottom on the left foot. Last took flexiril around 2100, roxicodone  5mg  around 2330, tylenol 1000 at 2130.

## 2024-01-09 NOTE — ED Provider Notes (Signed)
  Physical Exam   Vitals:   01/09/24 0522 01/09/24 0530 01/09/24 0631 01/09/24 0645  BP: (!) 148/88  (!) 149/90   Pulse: 99 88 93 91  Resp: 17  19   Temp:   99.2 F (37.3 C)   TempSrc:   Oral   SpO2: 97% 93% 99% 92%     Physical Exam  Procedures  Procedures  ED Course / MDM    Medical Decision Making Amount and/or Complexity of Data Reviewed Labs: ordered. Radiology: ordered.  Risk Prescription drug management.   Patient received at shift change from prior EDP Sarah Smoot, please see their note for initial history, physical exam findings, lab/imaging interpretations, as well as initial assessment and plan.  Patient presenting with low back pain.  Patient undergoing chemotherapy treatment for endometrial carcinoma s/p hysterectomy.  She reports slipping 1 week ago where she caught herself with her left leg and felt a pop followed by pain, her pain did resolve however has since returned.  No red flag back pain signs/symptoms, history of microdiscectomy of L-spine in the 1990s.  On oxycodone  for pain.  DG left hip:  No evidence of hip fracture or dislocation. Enthesopathic changes of the pelvis and greater trochanters.   CT:  1. No evidence of lumbar fractures. 2. Congenitally short pedicles with acquired spinal stenosis at L2-3, L3-4 and greatest at L4-5. 3. Left paracentral disc protrusion at L5-S1 with a least mild mass effect on the left S1 nerve root. 4. Additional mass effect on the right S1 nerve root at L5-S1 due to annular bulging and facet hypertrophy. 5. Bilateral mild-to-moderate foraminal stenosis at L5-S1. Mild foraminal stenosis L3-4 and L4-5. 6. Aortic atherosclerosis.  Patient required multiple rounds of dilaudid  for pain control, as well as IV solumedrol. Pending MRI L-spine and reassessment of patient's symptoms.   MRI: 1. Moderate-to-severe central spinal canal and bilateral lateral recess stenosis at L4-5 with impingement of the L5 nerves in the  lateral recesses bilaterally. 2. Moderate central spinal canal and bilateral lateral recess stenosis at L3-4 with possible impingement of the L4 nerves in the lateral recesses. 3. Mild central spinal canal and bilateral lateral recess stenosis at L2-3.   At time my reassessment, patient demonstrates 5/5 strength against resistance of bilateral lower extremities, she does endorse diminished sensation to the left leg, this does not follow any specific dermatomal distribution.  She reports that pain is primarily in her L buttocks with radiation of numbness/tingling down her left leg that is worse with standing/ambulation. She does not demonstrate any back pain red flag signs/symptoms.  She does express recurrent pain, will administer additional pain medication prior to discharge.  I discussed the findings of her MRI results in depth with her, as well as red flag back pain signs/symptoms to be aware of.  She has been seen by Dr. Colon with [facility] previously, as recently as 1 year ago, I recommend she contact their office to schedule follow-up in regard to her symptoms.  She voiced understanding and is in agreement this plan, return precautions discussed.  She is appropriate for discharge at this time.       Nicole Rosario SAILOR, [place] 01/09/24 1129    Dreama Rocky, MD 01/09/24 2120

## 2024-01-09 NOTE — ED Notes (Signed)
 Patient transported to X-ray

## 2024-01-09 NOTE — ED Notes (Signed)
 Coordinated with Kayla MRI Tech - informed pt is ready for imaging and would be needing medication prior to departure from department.

## 2024-01-09 NOTE — ED Notes (Signed)
 Patient transported to MRI

## 2024-01-13 ENCOUNTER — Other Ambulatory Visit (HOSPITAL_COMMUNITY): Payer: Self-pay

## 2024-01-13 DIAGNOSIS — M5416 Radiculopathy, lumbar region: Secondary | ICD-10-CM | POA: Diagnosis not present

## 2024-01-13 DIAGNOSIS — Z6832 Body mass index (BMI) 32.0-32.9, adult: Secondary | ICD-10-CM | POA: Diagnosis not present

## 2024-01-13 MED ORDER — OXYCODONE-ACETAMINOPHEN 5-325 MG PO TABS
1.0000 | ORAL_TABLET | Freq: Four times a day (QID) | ORAL | 0 refills | Status: DC | PRN
  Filled 2024-01-13: qty 40, 10d supply, fill #0

## 2024-01-14 DIAGNOSIS — M5416 Radiculopathy, lumbar region: Secondary | ICD-10-CM | POA: Diagnosis not present

## 2024-01-14 DIAGNOSIS — M5116 Intervertebral disc disorders with radiculopathy, lumbar region: Secondary | ICD-10-CM | POA: Diagnosis not present

## 2024-01-20 ENCOUNTER — Other Ambulatory Visit (HOSPITAL_COMMUNITY): Payer: Self-pay

## 2024-01-22 ENCOUNTER — Other Ambulatory Visit (HOSPITAL_COMMUNITY): Payer: Self-pay

## 2024-01-22 MED ORDER — OXYCODONE-ACETAMINOPHEN 5-325 MG PO TABS
1.0000 | ORAL_TABLET | Freq: Four times a day (QID) | ORAL | 0 refills | Status: DC | PRN
  Filled 2024-01-22: qty 40, 10d supply, fill #0

## 2024-01-22 MED ORDER — DEXAMETHASONE 4 MG PO TABS
8.0000 mg | ORAL_TABLET | Freq: Every morning | ORAL | 1 refills | Status: AC
  Filled 2024-01-22: qty 30, 15d supply, fill #0
  Filled 2024-02-02 – 2024-02-04 (×2): qty 30, 15d supply, fill #1

## 2024-01-23 DIAGNOSIS — Z90710 Acquired absence of both cervix and uterus: Secondary | ICD-10-CM | POA: Diagnosis not present

## 2024-01-23 DIAGNOSIS — Z9049 Acquired absence of other specified parts of digestive tract: Secondary | ICD-10-CM | POA: Diagnosis not present

## 2024-01-23 DIAGNOSIS — Z5111 Encounter for antineoplastic chemotherapy: Secondary | ICD-10-CM | POA: Diagnosis not present

## 2024-01-23 DIAGNOSIS — C55 Malignant neoplasm of uterus, part unspecified: Secondary | ICD-10-CM | POA: Diagnosis not present

## 2024-01-23 DIAGNOSIS — C541 Malignant neoplasm of endometrium: Secondary | ICD-10-CM | POA: Diagnosis not present

## 2024-01-23 DIAGNOSIS — Z85038 Personal history of other malignant neoplasm of large intestine: Secondary | ICD-10-CM | POA: Diagnosis not present

## 2024-01-23 DIAGNOSIS — M069 Rheumatoid arthritis, unspecified: Secondary | ICD-10-CM | POA: Diagnosis not present

## 2024-01-23 DIAGNOSIS — Z87891 Personal history of nicotine dependence: Secondary | ICD-10-CM | POA: Diagnosis not present

## 2024-01-23 DIAGNOSIS — Z90722 Acquired absence of ovaries, bilateral: Secondary | ICD-10-CM | POA: Diagnosis not present

## 2024-01-23 DIAGNOSIS — Z23 Encounter for immunization: Secondary | ICD-10-CM | POA: Diagnosis not present

## 2024-01-30 ENCOUNTER — Other Ambulatory Visit: Payer: Self-pay | Admitting: Medical Genetics

## 2024-01-30 DIAGNOSIS — Z006 Encounter for examination for normal comparison and control in clinical research program: Secondary | ICD-10-CM

## 2024-02-03 ENCOUNTER — Other Ambulatory Visit (HOSPITAL_COMMUNITY): Payer: Self-pay

## 2024-02-03 ENCOUNTER — Other Ambulatory Visit: Payer: Self-pay

## 2024-02-05 DIAGNOSIS — M5416 Radiculopathy, lumbar region: Secondary | ICD-10-CM | POA: Diagnosis not present

## 2024-02-05 DIAGNOSIS — Z6832 Body mass index (BMI) 32.0-32.9, adult: Secondary | ICD-10-CM | POA: Diagnosis not present

## 2024-02-13 DIAGNOSIS — Z87891 Personal history of nicotine dependence: Secondary | ICD-10-CM | POA: Diagnosis not present

## 2024-02-13 DIAGNOSIS — D701 Agranulocytosis secondary to cancer chemotherapy: Secondary | ICD-10-CM | POA: Diagnosis not present

## 2024-02-13 DIAGNOSIS — C55 Malignant neoplasm of uterus, part unspecified: Secondary | ICD-10-CM | POA: Diagnosis not present

## 2024-02-13 DIAGNOSIS — Z5111 Encounter for antineoplastic chemotherapy: Secondary | ICD-10-CM | POA: Diagnosis not present

## 2024-02-13 DIAGNOSIS — D6481 Anemia due to antineoplastic chemotherapy: Secondary | ICD-10-CM | POA: Diagnosis not present

## 2024-02-13 DIAGNOSIS — M5116 Intervertebral disc disorders with radiculopathy, lumbar region: Secondary | ICD-10-CM | POA: Diagnosis not present

## 2024-02-13 DIAGNOSIS — Z23 Encounter for immunization: Secondary | ICD-10-CM | POA: Diagnosis not present

## 2024-02-13 DIAGNOSIS — C541 Malignant neoplasm of endometrium: Secondary | ICD-10-CM | POA: Diagnosis not present

## 2024-02-15 ENCOUNTER — Other Ambulatory Visit: Payer: Self-pay | Admitting: Family Medicine

## 2024-02-18 ENCOUNTER — Other Ambulatory Visit (HOSPITAL_COMMUNITY): Payer: Self-pay

## 2024-02-18 MED ORDER — HYOSCYAMINE SULFATE 0.125 MG SL SUBL
0.1250 mg | SUBLINGUAL_TABLET | SUBLINGUAL | 1 refills | Status: AC | PRN
  Filled 2024-02-18: qty 90, 15d supply, fill #0

## 2024-03-12 DIAGNOSIS — M5116 Intervertebral disc disorders with radiculopathy, lumbar region: Secondary | ICD-10-CM | POA: Diagnosis not present

## 2024-03-12 DIAGNOSIS — M5416 Radiculopathy, lumbar region: Secondary | ICD-10-CM | POA: Diagnosis not present

## 2024-03-13 DIAGNOSIS — D701 Agranulocytosis secondary to cancer chemotherapy: Secondary | ICD-10-CM | POA: Diagnosis not present

## 2024-03-13 DIAGNOSIS — Z90710 Acquired absence of both cervix and uterus: Secondary | ICD-10-CM | POA: Diagnosis not present

## 2024-03-13 DIAGNOSIS — T451X5A Adverse effect of antineoplastic and immunosuppressive drugs, initial encounter: Secondary | ICD-10-CM | POA: Diagnosis not present

## 2024-03-13 DIAGNOSIS — D6481 Anemia due to antineoplastic chemotherapy: Secondary | ICD-10-CM | POA: Diagnosis not present

## 2024-03-13 DIAGNOSIS — C541 Malignant neoplasm of endometrium: Secondary | ICD-10-CM | POA: Diagnosis not present

## 2024-03-13 DIAGNOSIS — M5116 Intervertebral disc disorders with radiculopathy, lumbar region: Secondary | ICD-10-CM | POA: Diagnosis not present

## 2024-03-13 DIAGNOSIS — C55 Malignant neoplasm of uterus, part unspecified: Secondary | ICD-10-CM | POA: Diagnosis not present

## 2024-03-13 DIAGNOSIS — Z90722 Acquired absence of ovaries, bilateral: Secondary | ICD-10-CM | POA: Diagnosis not present

## 2024-03-13 DIAGNOSIS — Z87891 Personal history of nicotine dependence: Secondary | ICD-10-CM | POA: Diagnosis not present

## 2024-03-13 DIAGNOSIS — Z5111 Encounter for antineoplastic chemotherapy: Secondary | ICD-10-CM | POA: Diagnosis not present

## 2024-03-25 DIAGNOSIS — C541 Malignant neoplasm of endometrium: Secondary | ICD-10-CM | POA: Diagnosis not present

## 2024-03-26 ENCOUNTER — Other Ambulatory Visit: Payer: Self-pay

## 2024-04-06 DIAGNOSIS — C55 Malignant neoplasm of uterus, part unspecified: Secondary | ICD-10-CM | POA: Diagnosis not present

## 2024-04-06 DIAGNOSIS — Z5111 Encounter for antineoplastic chemotherapy: Secondary | ICD-10-CM | POA: Diagnosis not present

## 2024-04-20 ENCOUNTER — Encounter: Payer: Self-pay | Admitting: Family Medicine

## 2024-04-21 ENCOUNTER — Other Ambulatory Visit (HOSPITAL_COMMUNITY): Payer: Self-pay

## 2024-04-21 MED ORDER — CYCLOBENZAPRINE HCL 10 MG PO TABS
10.0000 mg | ORAL_TABLET | Freq: Every evening | ORAL | 0 refills | Status: AC | PRN
  Filled 2024-04-21: qty 30, 30d supply, fill #0

## 2024-04-22 ENCOUNTER — Other Ambulatory Visit (HOSPITAL_COMMUNITY): Payer: Self-pay

## 2024-04-22 MED ORDER — METHYLPREDNISOLONE 4 MG PO TBPK
ORAL_TABLET | ORAL | 0 refills | Status: DC
  Filled 2024-04-22: qty 21, 6d supply, fill #0

## 2024-04-23 ENCOUNTER — Other Ambulatory Visit (HOSPITAL_COMMUNITY): Payer: Self-pay

## 2024-04-30 ENCOUNTER — Other Ambulatory Visit: Payer: Self-pay

## 2024-05-01 ENCOUNTER — Other Ambulatory Visit (HOSPITAL_COMMUNITY): Payer: Self-pay

## 2024-05-01 MED ORDER — OXYCODONE-ACETAMINOPHEN 5-325 MG PO TABS
1.0000 | ORAL_TABLET | Freq: Four times a day (QID) | ORAL | 0 refills | Status: AC | PRN
  Filled 2024-05-01: qty 40, 10d supply, fill #0

## 2024-05-02 ENCOUNTER — Other Ambulatory Visit (HOSPITAL_COMMUNITY): Payer: Self-pay

## 2024-12-11 ENCOUNTER — Encounter: Admitting: Family Medicine
